# Patient Record
Sex: Male | Born: 1970 | Race: White | Hispanic: No | Marital: Married | Smoking: Current every day smoker
Health system: Southern US, Community
[De-identification: ages and names within clinical notes are randomized; demographics above are authoritative.]

## PROBLEM LIST (undated history)

## (undated) DIAGNOSIS — F419 Anxiety disorder, unspecified: Secondary | ICD-10-CM

## (undated) DIAGNOSIS — F319 Bipolar disorder, unspecified: Secondary | ICD-10-CM

## (undated) DIAGNOSIS — F1120 Opioid dependence, uncomplicated: Secondary | ICD-10-CM

## (undated) DIAGNOSIS — E039 Hypothyroidism, unspecified: Secondary | ICD-10-CM

## (undated) HISTORY — PX: NO PAST SURGERIES: SHX2092

---

## 1999-02-02 ENCOUNTER — Emergency Department (HOSPITAL_COMMUNITY): Admission: EM | Admit: 1999-02-02 | Discharge: 1999-02-02 | Payer: Self-pay | Admitting: Internal Medicine

## 2008-01-14 ENCOUNTER — Encounter: Admission: RE | Admit: 2008-01-14 | Discharge: 2008-01-14 | Payer: Self-pay | Admitting: Chiropractic Medicine

## 2011-01-15 ENCOUNTER — Emergency Department (HOSPITAL_COMMUNITY)
Admission: EM | Admit: 2011-01-15 | Discharge: 2011-01-15 | Disposition: A | Discharge status: Home / Self Care | Payer: Self-pay | Attending: Emergency Medicine | Admitting: Emergency Medicine

## 2011-01-15 DIAGNOSIS — F1110 Opioid abuse, uncomplicated: Secondary | ICD-10-CM | POA: Insufficient documentation

## 2011-02-20 ENCOUNTER — Ambulatory Visit (HOSPITAL_COMMUNITY)
Admission: RE | Admit: 2011-02-20 | Discharge: 2011-02-20 | Disposition: A | Discharge status: Home / Self Care | Payer: Self-pay | Attending: Psychiatry | Admitting: Psychiatry

## 2011-02-20 DIAGNOSIS — F1920 Other psychoactive substance dependence, uncomplicated: Secondary | ICD-10-CM | POA: Insufficient documentation

## 2011-02-25 ENCOUNTER — Other Ambulatory Visit (HOSPITAL_COMMUNITY): Payer: Self-pay | Attending: Psychiatry | Admitting: Psychiatry

## 2011-02-25 DIAGNOSIS — M549 Dorsalgia, unspecified: Secondary | ICD-10-CM | POA: Insufficient documentation

## 2011-02-25 DIAGNOSIS — F1910 Other psychoactive substance abuse, uncomplicated: Secondary | ICD-10-CM | POA: Insufficient documentation

## 2011-02-25 DIAGNOSIS — G8929 Other chronic pain: Secondary | ICD-10-CM | POA: Insufficient documentation

## 2011-02-27 ENCOUNTER — Other Ambulatory Visit (HOSPITAL_COMMUNITY): Payer: Self-pay | Admitting: Psychiatry

## 2011-03-01 ENCOUNTER — Other Ambulatory Visit (HOSPITAL_COMMUNITY): Payer: Self-pay | Admitting: Psychiatry

## 2011-03-04 ENCOUNTER — Other Ambulatory Visit (HOSPITAL_COMMUNITY): Payer: Self-pay | Admitting: Psychiatry

## 2011-03-05 LAB — URINE DRUGS OF ABUSE SCREEN W ALC, ROUTINE (REF LAB)
Amphetamine Screen, Ur: NEGATIVE
Benzodiazepines.: NEGATIVE
Marijuana Metabolite: NEGATIVE
Methadone: NEGATIVE
Propoxyphene: NEGATIVE

## 2011-03-06 ENCOUNTER — Other Ambulatory Visit (HOSPITAL_COMMUNITY): Payer: Self-pay | Admitting: Psychiatry

## 2011-03-06 LAB — ETHANOL CONFIRM, URINE: Ethanol, Ur - Confirmation: 0.002 GMS% (ref ?–0.04)

## 2011-03-08 ENCOUNTER — Other Ambulatory Visit (HOSPITAL_COMMUNITY): Payer: Self-pay | Admitting: Psychiatry

## 2011-03-11 ENCOUNTER — Other Ambulatory Visit (HOSPITAL_COMMUNITY): Payer: Self-pay | Admitting: Psychiatry

## 2011-03-13 ENCOUNTER — Other Ambulatory Visit (HOSPITAL_COMMUNITY): Payer: Self-pay | Admitting: Psychiatry

## 2011-03-15 ENCOUNTER — Other Ambulatory Visit (HOSPITAL_COMMUNITY): Payer: Self-pay | Admitting: Psychiatry

## 2011-03-18 ENCOUNTER — Other Ambulatory Visit (HOSPITAL_COMMUNITY): Payer: Self-pay | Admitting: Psychiatry

## 2011-03-20 ENCOUNTER — Other Ambulatory Visit (HOSPITAL_COMMUNITY): Payer: Self-pay | Admitting: Psychiatry

## 2011-03-22 ENCOUNTER — Other Ambulatory Visit (HOSPITAL_COMMUNITY): Payer: Self-pay | Admitting: Psychiatry

## 2011-03-25 ENCOUNTER — Other Ambulatory Visit (HOSPITAL_COMMUNITY): Payer: Self-pay | Attending: Psychiatry | Admitting: Psychiatry

## 2011-03-29 ENCOUNTER — Other Ambulatory Visit (HOSPITAL_COMMUNITY): Payer: Self-pay | Admitting: Psychiatry

## 2011-04-01 ENCOUNTER — Other Ambulatory Visit (HOSPITAL_COMMUNITY): Payer: Self-pay | Admitting: Psychiatry

## 2011-04-03 ENCOUNTER — Other Ambulatory Visit (HOSPITAL_COMMUNITY): Payer: Self-pay | Admitting: Psychiatry

## 2011-04-05 ENCOUNTER — Other Ambulatory Visit (HOSPITAL_COMMUNITY): Payer: Self-pay | Admitting: Psychiatry

## 2011-04-08 ENCOUNTER — Other Ambulatory Visit (HOSPITAL_COMMUNITY): Payer: Self-pay | Admitting: Psychiatry

## 2011-04-09 ENCOUNTER — Ambulatory Visit (INDEPENDENT_AMBULATORY_CARE_PROVIDER_SITE_OTHER): Payer: Self-pay | Admitting: Behavioral Health

## 2011-04-09 DIAGNOSIS — F411 Generalized anxiety disorder: Secondary | ICD-10-CM

## 2011-04-09 DIAGNOSIS — F331 Major depressive disorder, recurrent, moderate: Secondary | ICD-10-CM

## 2011-04-10 ENCOUNTER — Other Ambulatory Visit (HOSPITAL_COMMUNITY): Payer: Self-pay | Admitting: Psychiatry

## 2011-04-12 ENCOUNTER — Other Ambulatory Visit (HOSPITAL_COMMUNITY): Payer: Self-pay | Admitting: Psychiatry

## 2011-04-15 ENCOUNTER — Other Ambulatory Visit (HOSPITAL_COMMUNITY): Payer: Self-pay | Admitting: Psychiatry

## 2011-04-17 ENCOUNTER — Other Ambulatory Visit (HOSPITAL_COMMUNITY): Payer: Self-pay | Admitting: Psychiatry

## 2011-04-19 ENCOUNTER — Other Ambulatory Visit (HOSPITAL_COMMUNITY): Payer: Self-pay | Admitting: Psychiatry

## 2011-04-23 ENCOUNTER — Encounter (INDEPENDENT_AMBULATORY_CARE_PROVIDER_SITE_OTHER): Payer: Self-pay | Admitting: Behavioral Health

## 2011-04-23 DIAGNOSIS — F411 Generalized anxiety disorder: Secondary | ICD-10-CM

## 2011-05-07 ENCOUNTER — Encounter (HOSPITAL_COMMUNITY): Payer: Self-pay | Admitting: Behavioral Health

## 2011-05-14 ENCOUNTER — Encounter (HOSPITAL_COMMUNITY): Payer: Self-pay | Admitting: Behavioral Health

## 2011-05-21 ENCOUNTER — Encounter (INDEPENDENT_AMBULATORY_CARE_PROVIDER_SITE_OTHER): Payer: Self-pay | Admitting: Behavioral Health

## 2011-05-21 DIAGNOSIS — F411 Generalized anxiety disorder: Secondary | ICD-10-CM

## 2011-06-04 ENCOUNTER — Encounter (HOSPITAL_COMMUNITY): Payer: Self-pay | Admitting: Behavioral Health

## 2012-01-19 ENCOUNTER — Encounter (HOSPITAL_COMMUNITY): Payer: Self-pay

## 2012-01-19 ENCOUNTER — Emergency Department (HOSPITAL_COMMUNITY)
Admission: EM | Admit: 2012-01-19 | Discharge: 2012-01-19 | Disposition: A | Discharge status: Home / Self Care | Payer: Self-pay | Attending: Emergency Medicine | Admitting: Emergency Medicine

## 2012-01-19 DIAGNOSIS — F17200 Nicotine dependence, unspecified, uncomplicated: Secondary | ICD-10-CM | POA: Insufficient documentation

## 2012-01-19 DIAGNOSIS — S0191XA Laceration without foreign body of unspecified part of head, initial encounter: Secondary | ICD-10-CM

## 2012-01-19 DIAGNOSIS — S0180XA Unspecified open wound of other part of head, initial encounter: Secondary | ICD-10-CM | POA: Insufficient documentation

## 2012-01-19 HISTORY — DX: Opioid dependence, uncomplicated: F11.20

## 2012-01-19 MED ORDER — LIDOCAINE-EPINEPHRINE (PF) 2 %-1:200000 IJ SOLN: 10.0000 mL | Freq: Once | INTRAMUSCULAR | Status: DC

## 2012-01-19 MED ORDER — AMOXICILLIN-POT CLAVULANATE 875-125 MG PO TABS: 1.0000 | ORAL_TABLET | Freq: Two times a day (BID) | ORAL | Status: DC
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
## 2012-01-19 NOTE — ED Notes (Signed)
Pt in from home with laceration to the forehead states bumped his head denies loc bleeding controlled denies pain
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
## 2012-01-21 NOTE — ED Provider Notes (Signed)
Medical screening examination/treatment/procedure(s) were performed by non-physician practitioner and as supervising physician I was immediately available for consultation/collaboration.  Raeford Razor, MD 01/21/12 1900

## 2012-01-24 ENCOUNTER — Encounter (HOSPITAL_COMMUNITY): Payer: Self-pay | Admitting: Emergency Medicine

## 2012-01-24 ENCOUNTER — Emergency Department (HOSPITAL_COMMUNITY)
Admission: EM | Admit: 2012-01-24 | Discharge: 2012-01-24 | Disposition: A | Discharge status: Home / Self Care | Payer: Self-pay | Attending: Emergency Medicine | Admitting: Emergency Medicine

## 2012-01-24 DIAGNOSIS — Z4802 Encounter for removal of sutures: Secondary | ICD-10-CM | POA: Insufficient documentation

## 2012-01-24 NOTE — ED Notes (Signed)
Pt presenting to ed with c/o suture removal pt states his wife removed all of them but the one that she couldn't get out. Pt states he did not want to come back here because he's uninsured. Pt is alert and oriented at this time. Pt is in nad
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
## 2012-01-24 NOTE — Discharge Instructions (Signed)
Suture Removal  Your caregiver has removed your sutures today. If skin adhesive strips were applied at the time of suturing, or applied following removal of the sutures today, they will begin to peel off in a couple more days. If skin adhesive strips remain after 14 days, they may be removed.  HOME CARE INSTRUCTIONS     Change any bandages (dressings) at least once a day or as directed by your caregiver. If the bandage sticks, soak it off with warm, soapy water.    Wash the area with soap and water to remove all the cream or ointment (if you were instructed to use any) 2 times a day. Rinse off the soap and pat the area dry with a clean towel.    Reapply cream or ointment as directed by your caregiver. This will help prevent infection and keep the bandage from sticking.    Keep the wound area dry and clean. If the bandage becomes wet, dirty, or develops a bad smell, change it as soon as possible.    Only take over-the-counter or prescription medicines for pain, discomfort, or fever as directed by your caregiver.    Use sunscreen when out in the sun. New scars become sunburned easily.    Return to your caregivers office in in 7 days or as directed to have your sutures removed.   You may need a tetanus shot if:   You cannot remember when you had your last tetanus shot.    You have never had a tetanus shot.    The injury broke your skin.   If you got a tetanus shot, your arm may swell, get red, and feel warm to the touch. This is common and not a problem. If you need a tetanus shot and you choose not to have one, there is a rare chance of getting tetanus. Sickness from tetanus can be serious.  SEEK IMMEDIATE MEDICAL CARE IF:     There is redness, swelling, or increasing pain in the wound.    Pus is coming from the wound.    An unexplained oral temperature above 102 F (38.9 C) develops.    You notice a bad smell coming from the wound or dressing.     The wound breaks open (edges not staying together) after sutures have been removed.   Document Released: 06/04/2001 Document Revised: 08/29/2011 Document Reviewed: 08/03/2007  [employer] Patient Information 2012 [employer]

## 2012-01-25 NOTE — ED Provider Notes (Signed)
Medical screening examination/treatment/procedure(s) were performed by non-physician practitioner and as supervising physician I was immediately available for consultation/collaboration.   Gerhard Munch, MD 01/25/12 [phone]

## 2012-03-02 ENCOUNTER — Emergency Department (HOSPITAL_COMMUNITY)
Admission: EM | Admit: 2012-03-02 | Discharge: 2012-03-02 | Disposition: A | Discharge status: Home / Self Care | Payer: Self-pay | Attending: Emergency Medicine | Admitting: Emergency Medicine

## 2012-03-02 ENCOUNTER — Encounter (HOSPITAL_COMMUNITY): Payer: Self-pay | Admitting: Emergency Medicine

## 2012-03-02 DIAGNOSIS — F17200 Nicotine dependence, unspecified, uncomplicated: Secondary | ICD-10-CM | POA: Insufficient documentation

## 2012-03-02 DIAGNOSIS — R52 Pain, unspecified: Secondary | ICD-10-CM | POA: Insufficient documentation

## 2012-03-02 DIAGNOSIS — M5440 Lumbago with sciatica, unspecified side: Secondary | ICD-10-CM

## 2012-03-02 DIAGNOSIS — F1120 Opioid dependence, uncomplicated: Secondary | ICD-10-CM | POA: Insufficient documentation

## 2012-03-02 DIAGNOSIS — M5430 Sciatica, unspecified side: Secondary | ICD-10-CM | POA: Insufficient documentation

## 2012-03-02 MED ORDER — PREDNISONE 20 MG PO TABS
60.0000 mg | ORAL_TABLET | Freq: Once | ORAL | Status: AC
  Administered 2012-03-02: 60 mg via ORAL
  Filled 2012-03-02: qty 1

## 2012-03-02 MED ORDER — METHOCARBAMOL 750 MG PO TABS: 1500.0000 mg | ORAL_TABLET | Freq: Three times a day (TID) | ORAL | Status: AC

## 2012-03-02 MED ORDER — IBUPROFEN 800 MG PO TABS: 800.0000 mg | ORAL_TABLET | Freq: Three times a day (TID) | ORAL | Status: AC

## 2012-03-02 MED ORDER — IBUPROFEN 800 MG PO TABS
800.0000 mg | ORAL_TABLET | Freq: Once | ORAL | Status: AC
  Administered 2012-03-02: 800 mg via ORAL
  Filled 2012-03-02: qty 1

## 2012-03-02 MED ORDER — PREDNISONE 20 MG PO TABS: 60.0000 mg | ORAL_TABLET | Freq: Every day | ORAL | Status: AC

## 2012-03-02 MED ORDER — METHOCARBAMOL 500 MG PO TABS
1000.0000 mg | ORAL_TABLET | Freq: Once | ORAL | Status: DC
  Filled 2012-03-02: qty 2

## 2012-03-02 NOTE — ED Provider Notes (Signed)
History     CSN: [id number]  Arrival date & time 03/02/12  0605   First MD Initiated Contact with Patient 03/02/12 [phone]      Chief Complaint  Patient presents with  . Back Pain    (Consider location/radiation/quality/duration/timing/severity/associated sxs/prior treatment) HPI 41 yo male presents to the ER with complaint of low back pain radiating down right buttock and into leg.  Pt with h/o of chronic back pain, opiate addiction due to prior pain medications.  Pt slipped yesterday while running on wet pool deck.  Pt denies bowel or bladder incontinence, no foot drop, no sacral numbness.  Pt has tried several patches of unknown medication from friends, topical cream.  Pt has nsaids and other medications he has tried as well.  Pt requesting MRI to "get to the bottom of this"  Past Medical History  Diagnosis Date  . Opiate addiction     History reviewed. No pertinent past surgical history.  No family history on file.  History  Substance Use Topics  . Smoking status: Current Everyday Smoker  . Smokeless tobacco: Not on file  . Alcohol Use: Yes     as often as possible but my family prevents me from drinking most of the time      Review of Systems  All other systems reviewed and are negative.    Allergies  Review of patient's allergies indicates no known allergies.  Home Medications   Current Outpatient Rx  Name Route Sig Dispense Refill  . BUPRENORPHINE HCL-NALOXONE HCL 8-2 MG SL SUBL Sublingual Place 1 tablet under the tongue 2 (two) times daily.       BP 110/89  Pulse 63  Temp 98 F (36.7 C)  Resp 16  Wt 150 lb (68.04 kg)  SpO2 99%  Physical Exam  Nursing note and vitals reviewed. Constitutional: He is oriented to person, place, and time. He appears well-developed and well-nourished.  HENT:  Head: Normocephalic and atraumatic.  Nose: Nose normal.  Mouth/Throat: Oropharynx is clear and moist.  Eyes: Conjunctivae and EOM are normal. Pupils are equal,  round, and reactive to light.  Neck: Normal range of motion. Neck supple. No JVD present. No tracheal deviation present. No thyromegaly present.  Cardiovascular: Normal rate, regular rhythm, normal heart sounds and intact distal pulses.  Exam reveals no gallop and no friction rub.   No murmur heard. Pulmonary/Chest: Effort normal and breath sounds normal. No stridor. No respiratory distress. He has no wheezes. He has no rales. He exhibits no tenderness.  Abdominal: Soft. Bowel sounds are normal. He exhibits no distension and no mass. There is no tenderness. There is no rebound and no guarding.  Musculoskeletal: Normal range of motion. He exhibits no edema and no tenderness.       No paraspinal muscle tenderness, mild right buttock and SI joint tenderness  Lymphadenopathy:    He has no cervical adenopathy.  Neurological: He is oriented to person, place, and time. He has normal reflexes. No cranial nerve deficit. He exhibits normal muscle tone. Coordination normal.       +positive straight leg raise on left.  Normal sensation  Skin: Skin is dry. No rash noted. No erythema. No pallor.  Psychiatric: He has a normal mood and affect. His behavior is normal. Judgment and thought content normal.    ED Course  Procedures (including critical care time)  Labs Reviewed - No data to display No results found.   1. Acute back pain with sciatica  MDM  41 yo male with acute on chronic low back pain, suspect sciatica.  No signs of spinal cord injury on exam, no red flags.  Pt requesting MRI, but no acute neurologic findings to require emergent MRI.  Will treat with nsaids, steroids, muscle relaxants without opiates given h/o suboxone use and opiate addiction       Olivia Mackie, MD 03/02/12 [phone]
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
## 2012-03-02 NOTE — Discharge Instructions (Signed)
Take medications as prescribed.  Follow up with orthopedics if pain does not improve.  Return to the ER for worsening pain, numbness, weakness, loss of bowel or bladder control or other concerning symptoms.  Sciatica with Rehab The sciatic nerve runs from the back down the leg and is responsible for sensation and control of the muscles in the back (posterior) side of the thigh, lower leg, and foot. Sciatica is a condition that is characterized by inflammation of this nerve.  SYMPTOMS   Signs of nerve damage, including numbness and/or weakness along the posterior side of the lower extremity.   Pain in the back of the thigh that may also travel down the leg.   Pain that worsens when sitting for long periods of time.   Occasionally, pain in the back or buttock.  CAUSES  Inflammation of the sciatic nerve is the cause of sciatica. The inflammation is due to something irritating the nerve. Common sources of irritation include:  Sitting for long periods of time.   Direct trauma to the nerve.   Arthritis of the spine.   Herniated or ruptured disk.   Slipping of the vertebrae (spondylolithesis)   Pressure from soft tissues, such as muscles or ligament-like tissue (fascia).  RISK INCREASES WITH:  Sports that place pressure or stress on the spine (football or weightlifting).   Poor strength and flexibility.   Failure to warm-up properly before activity.   Family history of low back pain or disk disorders.   Previous back injury or surgery.   Poor body mechanics, especially when lifting, or poor posture.  PREVENTION   Warm up and stretch properly before activity.   Maintain physical fitness:   Strength, flexibility, and endurance.   Cardiovascular fitness.   Learn and use proper technique, especially with posture and lifting. When possible, have coach correct improper technique.   Avoid activities that place stress on the spine.  PROGNOSIS If treated properly, then sciatica  usually resolves within 6 weeks. However, occasionally surgery is necessary.  RELATED COMPLICATIONS   Permanent nerve damage, including pain, numbness, tingle, or weakness.   Chronic back pain.   Risks of surgery: infection, bleeding, nerve damage, or damage to surrounding tissues.  TREATMENT Treatment initially involves resting from any activities that aggravate your symptoms. The use of ice and medication may help reduce pain and inflammation. The use of strengthening and stretching exercises may help reduce pain with activity. These exercises may be performed at home or with referral to a therapist. A therapist may recommend further treatments, such as transcutaneous electronic nerve stimulation (TENS) or ultrasound. Your caregiver may recommend corticosteroid injections to help reduce inflammation of the sciatic nerve. If symptoms persist despite non-surgical (conservative) treatment, then surgery may be recommended. MEDICATION  If pain medication is necessary, then nonsteroidal anti-inflammatory medications, such as aspirin and ibuprofen, or other minor pain relievers, such as acetaminophen, are often recommended.   Do not take pain medication for 7 days before surgery.   Prescription pain relievers may be given if deemed necessary by your caregiver. Use only as directed and only as much as you need.   Ointments applied to the skin may be helpful.   Corticosteroid injections may be given by your caregiver. These injections should be reserved for the most serious cases, because they may only be given a certain number of times.  HEAT AND COLD  Cold treatment (icing) relieves pain and reduces inflammation. Cold treatment should be applied for 10 to 15 minutes every 2  to 3 hours for inflammation and pain and immediately after any activity that aggravates your symptoms. Use ice packs or massage the area with a piece of ice (ice massage).   Heat treatment may be used prior to performing the  stretching and strengthening activities prescribed by your caregiver, physical therapist, or athletic trainer. Use a heat pack or soak the injury in warm water.  SEEK MEDICAL CARE IF:  Treatment seems to offer no benefit, or the condition worsens.   Any medications produce adverse side effects.  EXERCISES  RANGE OF MOTION (ROM) AND STRETCHING EXERCISES - Sciatica Most people with sciatic will find that their symptoms worsen with either excessive bending forward (flexion) or arching at the low back (extension). The exercises which will help resolve your symptoms will focus on the opposite motion. Your physician, physical therapist or athletic trainer will help you determine which exercises will be most helpful to resolve your low back pain. Do not complete any exercises without first consulting with your clinician. Discontinue any exercises which worsen your symptoms until you speak to your clinician. If you have pain, numbness or tingling which travels down into your buttocks, leg or foot, the goal of the therapy is for these symptoms to move closer to your back and eventually resolve. Occasionally, these leg symptoms will get better, but your low back pain may worsen; this is typically an indication of progress in your rehabilitation. Be certain to be very alert to any changes in your symptoms and the activities in which you participated in the 24 hours prior to the change. Sharing this information with your clinician will allow him/her to most efficiently treat your condition. These exercises may help you when beginning to rehabilitate your injury. Your symptoms may resolve with or without further involvement from your physician, physical therapist or athletic trainer. While completing these exercises, remember:   Restoring tissue flexibility helps normal motion to return to the joints. This allows healthier, less painful movement and activity.   An effective stretch should be held for at least 30  seconds.   A stretch should never be painful. You should only feel a gentle lengthening or release in the stretched tissue.  FLEXION RANGE OF MOTION AND STRETCHING EXERCISES: STRETCH - Flexion, Single Knee to Chest   Lie on a firm bed or floor with both legs extended in front of you.   Keeping one leg in contact with the floor, bring your opposite knee to your chest. Hold your leg in place by either grabbing behind your thigh or at your knee.   Pull until you feel a gentle stretch in your low back. Hold ___30___ seconds.   Slowly release your grasp and repeat the exercise with the opposite side.  Repeat _____5_____ times. Complete this exercise ____2______ times per day.  STRETCH - Flexion, Double Knee to Chest  Lie on a firm bed or floor with both legs extended in front of you.   Keeping one leg in contact with the floor, bring your opposite knee to your chest.   Tense your stomach muscles to support your back and then lift your other knee to your chest. Hold your legs in place by either grabbing behind your thighs or at your knees.   Pull both knees toward your chest until you feel a gentle stretch in your low back. Hold ____30______ seconds.   Tense your stomach muscles and slowly return one leg at a time to the floor.  Repeat ____5______ times. Complete this exercise  ____2______ times per day.  STRETCH - Low Trunk Rotation   Lie on a firm bed or floor. Keeping your legs in front of you, bend your knees so they are both pointed toward the ceiling and your feet are flat on the floor.   Extend your arms out to the side. This will stabilize your upper body by keeping your shoulders in contact with the floor.   Gently and slowly drop both knees together to one side until you feel a gentle stretch in your low back. Hold for ____30______ seconds.   Tense your stomach muscles to support your low back as you bring your knees back to the starting position. Repeat the exercise to the other  side.  Repeat _____5_____ times. Complete this exercise ___2_______ times per day  EXTENSION RANGE OF MOTION AND FLEXIBILITY EXERCISES: STRETCH - Extension, Prone on Elbows  Lie on your stomach on the floor, a bed will be too soft. Place your palms about shoulder width apart and at the height of your head.   Place your elbows under your shoulders. If this is too painful, stack pillows under your chest.   Allow your body to relax so that your hips drop lower and make contact more completely with the floor.   Hold this position for _____30_____ seconds.   Slowly return to lying flat on the floor.  Repeat ____5______ times. Complete this exercise _____2_____ times per day.  RANGE OF MOTION - Extension, Prone Press Ups  Lie on your stomach on the floor, a bed will be too soft. Place your palms about shoulder width apart and at the height of your head.   Keeping your back as relaxed as possible, slowly straighten your elbows while keeping your hips on the floor. You may adjust the placement of your hands to maximize your comfort. As you gain motion, your hands will come more underneath your shoulders.   Hold this position ___30_______ seconds.   Slowly return to lying flat on the floor.  Repeat ___5_______ times. Complete this exercise ______2____ times per day.  STRENGTHENING EXERCISES - Sciatica  These exercises may help you when beginning to rehabilitate your injury. These exercises should be done near your "sweet spot." This is the neutral, low-back arch, somewhere between fully rounded and fully arched, that is your least painful position. When performed in this safe range of motion, these exercises can be used for people who have either a flexion or extension based injury. These exercises may resolve your symptoms with or without further involvement from your physician, physical therapist or athletic trainer. While completing these exercises, remember:   Muscles can gain both the  endurance and the strength needed for everyday activities through controlled exercises.   Complete these exercises as instructed by your physician, physical therapist or athletic trainer. Progress with the resistance and repetition exercises only as your caregiver advises.   You may experience muscle soreness or fatigue, but the pain or discomfort you are trying to eliminate should never worsen during these exercises. If this pain does worsen, stop and make certain you are following the directions exactly. If the pain is still present after adjustments, discontinue the exercise until you can discuss the trouble with your clinician.  STRENGTHENING - Deep Abdominals, Pelvic Tilt   Lie on a firm bed or floor. Keeping your legs in front of you, bend your knees so they are both pointed toward the ceiling and your feet are flat on the floor.   Tense your lower  abdominal muscles to press your low back into the floor. This motion will rotate your pelvis so that your tail bone is scooping upwards rather than pointing at your feet or into the floor.   With a gentle tension and even breathing, hold this position for _____30_____ seconds.  Repeat ____5______ times. Complete this exercise _____2_____ times per day.  STRENGTHENING - Abdominals, Crunches   Lie on a firm bed or floor. Keeping your legs in front of you, bend your knees so they are both pointed toward the ceiling and your feet are flat on the floor. Cross your arms over your chest.   Slightly tip your chin down without bending your neck.   Tense your abdominals and slowly lift your trunk high enough to just clear your shoulder blades. Lifting higher can put excessive stress on the low back and does not further strengthen your abdominal muscles.   Control your return to the starting position.  Repeat ___10_______ times. Complete this exercise _____2_____ times per day.  STRENGTHENING - Quadruped, Opposite UE/LE Lift  Assume a hands and knees  position on a firm surface. Keep your hands under your shoulders and your knees under your hips. You may place padding under your knees for comfort.   Find your neutral spine and gently tense your abdominal muscles so that you can maintain this position. Your shoulders and hips should form a rectangle that is parallel with the floor and is not twisted.   Keeping your trunk steady, lift your right hand no higher than your shoulder and then your left leg no higher than your hip. Make sure you are not holding your breath. Hold this position 30 seconds.   Continuing to keep your abdominal muscles tense and your back steady, slowly return to your starting position. Repeat with the opposite arm and leg.  Repeat __5_ times. Complete this exercise _____2____ times per day.  STRENGTHENING - Abdominals and Quadriceps, Straight Leg Raise   Lie on a firm bed or floor with both legs extended in front of you.   Keeping one leg in contact with the floor, bend the other knee so that your foot can rest flat on the floor.   Find your neutral spine, and tense your abdominal muscles to maintain your spinal position throughout the exercise.   Slowly lift your straight leg off the floor about 6 inches for a count of 15, making sure to not hold your breath.   Still keeping your neutral spine, slowly lower your leg all the way to the floor.  Repeat this exercise with each leg ___5_______ times. Complete this exercise ____2______ times per day. POSTURE AND BODY MECHANICS CONSIDERATIONS - Sciatica Keeping correct posture when sitting, standing or completing your activities will reduce the stress put on different body tissues, allowing injured tissues a chance to heal and limiting painful experiences. The following are general guidelines for improved posture. Your physician or physical therapist will provide you with any instructions specific to your needs. While reading these guidelines, remember:  The exercises  prescribed by your provider will help you have the flexibility and strength to maintain correct postures.   The correct posture provides the optimal environment for your joints to work. All of your joints have less wear and tear when properly supported by a spine with good posture. This means you will experience a healthier, less painful body.   Correct posture must be practiced with all of your activities, especially prolonged sitting and standing. Correct posture is as  important when doing repetitive low-stress activities (typing) as it is when doing a single heavy-load activity (lifting).  RESTING POSITIONS Consider which positions are most painful for you when choosing a resting position. If you have pain with flexion-based activities (sitting, bending, stooping, squatting), choose a position that allows you to rest in a less flexed posture. You would want to avoid curling into a fetal position on your side. If your pain worsens with extension-based activities (prolonged standing, working overhead), avoid resting in an extended position such as sleeping on your stomach. Most people will find more comfort when they rest with their spine in a more neutral position, neither too rounded nor too arched. Lying on a non-sagging bed on your side with a pillow between your knees, or on your back with a pillow under your knees will often provide some relief. Keep in mind, being in any one position for a prolonged period of time, no matter how correct your posture, can still lead to stiffness. PROPER SITTING POSTURE In order to minimize stress and discomfort on your spine, you must sit with correct posture Sitting with good posture should be effortless for a healthy body. Returning to good posture is a gradual process. Many people can work toward this most comfortably by using various supports until they have the flexibility and strength to maintain this posture on their own. When sitting with proper posture, your  ears will fall over your shoulders and your shoulders will fall over your hips. You should use the back of the chair to support your upper back. Your low back will be in a neutral position, just slightly arched. You may place a small pillow or folded towel at the base of your low back for support.  When working at a desk, create an environment that supports good, upright posture. Without extra support, muscles fatigue and lead to excessive strain on joints and other tissues. Keep these recommendations in mind: CHAIR:   A chair should be able to slide under your desk when your back makes contact with the back of the chair. This allows you to work closely.   The chair's height should allow your eyes to be level with the upper part of your monitor and your hands to be slightly lower than your elbows.  BODY POSITION  Your feet should make contact with the floor. If this is not possible, use a foot rest.   Keep your ears over your shoulders. This will reduce stress on your neck and low back.  INCORRECT SITTING POSTURES   If you are feeling tired and unable to assume a healthy sitting posture, do not slouch or slump. This puts excessive strain on your back tissues, causing more damage and pain. Healthier options include:   Using more support, like a lumbar pillow.   Switching tasks to something that requires you to be upright or walking.   Talking a brief walk.   Lying down to rest in a neutral-spine position.  PROLONGED STANDING WHILE SLIGHTLY LEANING FORWARD  When completing a task that requires you to lean forward while standing in one place for a long time, place either foot up on a stationary 2-4 inch high object to help maintain the best posture. When both feet are on the ground, the low back tends to lose its slight inward curve. If this curve flattens (or becomes too large), then the back and your other joints will experience too much stress, fatigue more quickly and can cause pain.  CORRECT STANDING POSTURES Proper standing posture should be assumed with all daily activities, even if they only take a few moments, like when brushing your teeth. As in sitting, your ears should fall over your shoulders and your shoulders should fall over your hips. You should keep a slight tension in your abdominal muscles to brace your spine. Your tailbone should point down to the ground, not behind your body, resulting in an over-extended swayback posture.  INCORRECT STANDING POSTURES  Common incorrect standing postures include a forward head, locked knees and/or an excessive swayback. WALKING Walk with an upright posture. Your ears, shoulders and hips should all line-up. PROLONGED ACTIVITY IN A FLEXED POSITION When completing a task that requires you to bend forward at your waist or lean over a low surface, try to find a way to stabilize 3 of 4 of your limbs. You can place a hand or elbow on your thigh or rest a knee on the surface you are reaching across. This will provide you more stability so that your muscles do not fatigue as quickly. By keeping your knees relaxed, or slightly bent, you will also reduce stress across your low back. CORRECT LIFTING TECHNIQUES DO :   Assume a wide stance. This will provide you more stability and the opportunity to get as close as possible to the object which you are lifting.   Tense your abdominals to brace your spine; then bend at the knees and hips. Keeping your back locked in a neutral-spine position, lift using your leg muscles. Lift with your legs, keeping your back straight.   Test the weight of unknown objects before attempting to lift them.   Try to keep your elbows locked down at your sides in order get the best strength from your shoulders when carrying an object.   Always ask for help when lifting heavy or awkward objects.  INCORRECT LIFTING TECHNIQUES DO NOT:   Lock your knees when lifting, even if it is a small object.   Bend and  twist. Pivot at your feet or move your feet when needing to change directions.   Assume that you cannot safely pick up a paperclip without proper posture.  Document Released: 09/09/2005 Document Revised: 08/29/2011 Document Reviewed: 12/22/2008 [facility] Patient Information 2012 [place], [place].

## 2012-04-21 ENCOUNTER — Encounter (HOSPITAL_COMMUNITY): Payer: Self-pay

## 2012-04-21 ENCOUNTER — Emergency Department (HOSPITAL_COMMUNITY)
Admission: EM | Admit: 2012-04-21 | Discharge: 2012-04-22 | Disposition: A | Discharge status: Home / Self Care | Payer: Self-pay | Attending: Emergency Medicine | Admitting: Emergency Medicine

## 2012-04-21 DIAGNOSIS — R634 Abnormal weight loss: Secondary | ICD-10-CM | POA: Insufficient documentation

## 2012-04-21 LAB — COMPREHENSIVE METABOLIC PANEL
Alkaline Phosphatase: 63 U/L (ref 39–117)
BUN: 6 mg/dL (ref 6–23)
CO2: 27 mEq/L (ref 19–32)
Chloride: 100 mEq/L (ref 96–112)
Creatinine, Ser: 0.99 mg/dL (ref 0.50–1.35)
GFR calc Af Amer: >90 mL/min (ref >90)
GFR calc non Af Amer: >90 mL/min (ref >90)
Glucose, Bld: 75 mg/dL (ref 70–99)
Potassium: 3.6 mEq/L (ref 3.5–5.1)
Total Bilirubin: 0.3 mg/dL (ref 0.3–1.2)

## 2012-04-21 LAB — CBC
HCT: 38.6 % — ABNORMAL LOW (ref 39.0–52.0)
Hemoglobin: 13.5 g/dL (ref 13.0–17.0)
MCV: 91.5 fL (ref 78.0–100.0)
WBC: 9.8 10*3/uL (ref 4.0–10.5)

## 2012-04-21 NOTE — ED Notes (Signed)
Pt states she is under a lot of stress and has lost about 50lbs over the last 3 months.

## 2012-04-21 NOTE — ED Notes (Signed)
Pt sts lost of weight contributed to stress.

## 2012-04-22 NOTE — ED Provider Notes (Signed)
The pt left after triage.  I did not perform a history of physical or medical screening examination of this patient  Lyanne Co, MD 04/22/12 [phone]

## 2012-04-22 NOTE — ED Notes (Signed)
Charge nurse made aware of pt leaving.

## 2012-04-22 NOTE — ED Notes (Addendum)
Pt stated that he wanted to go to his car to get something. Pt was advised not to leave until he was seen by the doctor to make sure labs were ok. Pt agreed to staying.

## 2012-04-22 NOTE — ED Notes (Signed)
Pt wanted to go to car and get something. Tech asked if he could and responded that he could not. Upon hearing this pt left room andstated that he could not take this any longer and that he would come back another day. Kristen from registration called to tell that pt stated he was leaving.

## 2013-09-15 ENCOUNTER — Emergency Department (HOSPITAL_COMMUNITY)
Admission: EM | Admit: 2013-09-15 | Discharge: 2013-09-15 | Disposition: A | Discharge status: Home / Self Care | Payer: BC Managed Care – PPO | Attending: Emergency Medicine | Admitting: Emergency Medicine

## 2013-09-15 ENCOUNTER — Encounter (HOSPITAL_COMMUNITY): Payer: Self-pay | Admitting: Emergency Medicine

## 2013-09-15 DIAGNOSIS — Z79899 Other long term (current) drug therapy: Secondary | ICD-10-CM | POA: Insufficient documentation

## 2013-09-15 DIAGNOSIS — F17200 Nicotine dependence, unspecified, uncomplicated: Secondary | ICD-10-CM | POA: Insufficient documentation

## 2013-09-15 DIAGNOSIS — Z202 Contact with and (suspected) exposure to infections with a predominantly sexual mode of transmission: Secondary | ICD-10-CM

## 2013-09-15 LAB — URINALYSIS, ROUTINE W REFLEX MICROSCOPIC
Glucose, UA: NEGATIVE mg/dL
Ketones, ur: NEGATIVE mg/dL
Leukocytes, UA: NEGATIVE
Nitrite: NEGATIVE
Protein, ur: NEGATIVE mg/dL
Urobilinogen, UA: 0.2 mg/dL (ref 0.0–1.0)

## 2013-09-15 LAB — HIV ANTIBODY (ROUTINE TESTING W REFLEX): HIV: NONREACTIVE

## 2013-09-15 MED ORDER — AZITHROMYCIN 250 MG PO TABS
1000.0000 mg | ORAL_TABLET | Freq: Once | ORAL | Status: AC
  Administered 2013-09-15: 1000 mg via ORAL
  Filled 2013-09-15: qty 4

## 2013-09-15 MED ORDER — LIDOCAINE HCL 1 % IJ SOLN
INTRAMUSCULAR | Status: AC
  Filled 2013-09-15: qty 20

## 2013-09-15 MED ORDER — CEFTRIAXONE SODIUM 250 MG IJ SOLR
250.0000 mg | Freq: Once | INTRAMUSCULAR | Status: AC
  Administered 2013-09-15: 250 mg via INTRAMUSCULAR
  Filled 2013-09-15: qty 250

## 2013-09-15 NOTE — ED Notes (Signed)
Patient's girlfriend reported having a bump in her vaginal area.  Patient wants to be checked out to make sure he doesn't have an STD.  No symptoms at this time.

## 2013-09-15 NOTE — ED Provider Notes (Signed)
CSN: [id number]     Arrival date & time 09/15/13  5621 History   First MD Initiated Contact with Patient 09/15/13 [phone]     Chief Complaint  Patient presents with  . Exposure to STD   (Consider location/radiation/quality/duration/timing/severity/associated sxs/prior Treatment) HPI Comments: Patient requesting STD check. His wife/girlfriend reported to him that the she had a "something on her vagina". He does not know what. He denies any symptoms. No discharge, abdominal pain, testicular pain, dysuria hematuria. 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 He has a remote history of opiate abuse and is on Suboxone. He admits to relapsing with IV heroin "occasionally". He is sexually active with one partner, last activity 1 week ago.  The history is provided by the patient.    Past Medical History  Diagnosis Date  . Opiate addiction    History reviewed. No pertinent past surgical history. No family history on file. History  Substance Use Topics  . Smoking status: Current Every Day Smoker -- 0.50 packs/day    Types: Cigarettes  . Smokeless tobacco: Not on file  . Alcohol Use: Yes     Comment: as often as possible but my family prevents me from drinking most of the time    Review of Systems  Constitutional: Negative for fever, activity change and appetite change.  Respiratory: Negative for cough, chest tightness and shortness of breath.   Cardiovascular: Negative for chest pain.  Gastrointestinal: Negative for nausea, vomiting and abdominal pain.  Genitourinary: Negative for dysuria, hematuria, penile swelling, scrotal swelling and genital sores.  Musculoskeletal: Negative for back pain.  Skin: Negative for wound.  Neurological: Negative for dizziness, weakness and headaches.  A complete 10 system review of systems was obtained and all systems are negative except as noted in the HPI and PMH.    Allergies  Review of patient's allergies indicates no known  allergies.  Home Medications   Current Outpatient Rx  Name  Route  Sig  Dispense  Refill  . ALPRAZolam (XANAX) 1 MG tablet   Oral   Take 0.5 mg by mouth daily as needed. Anxiety.         . buprenorphine-naloxone (SUBOXONE) 8-2 MG SUBL   Sublingual   Place 1 tablet under the tongue 2 (two) times daily.           BP 124/101  Pulse 71  Temp(Src) 97.7 F (36.5 C) (Oral)  Resp 16  SpO2 97% Physical Exam  Constitutional: He is oriented to person, place, and time. He appears well-developed and well-nourished. No distress.  HENT:  Head: Normocephalic and atraumatic.  Mouth/Throat: Oropharynx is clear and moist. No oropharyngeal exudate.  Eyes: Conjunctivae and EOM are normal. Pupils are equal, round, and reactive to light.  Neck: Normal range of motion. Neck supple.  Cardiovascular: Normal rate, regular rhythm and normal heart sounds.   Pulmonary/Chest: Effort normal and breath sounds normal. No respiratory distress.  Abdominal: Soft. There is no tenderness. There is no rebound and no guarding.  Genitourinary:  Normal external genitalia.  No lesions.  No discharge, no testicular tenderness  Musculoskeletal: Normal range of motion. He exhibits no edema and no tenderness.  Neurological: He is alert and oriented to person, place, and time. No cranial nerve deficit. He exhibits normal muscle tone. Coordination normal.  Skin: Skin is warm.    ED Course  Procedures (including critical care time) Labs Review Labs Reviewed  URINALYSIS, ROUTINE W REFLEX MICROSCOPIC - Abnormal; Notable  for the following:    APPearance CLOUDY (*)    All other components within normal limits  GC/CHLAMYDIA PROBE AMP  RPR  HIV ANTIBODY (ROUTINE TESTING)  URINE MICROSCOPIC-ADD ON   Imaging Review No results found.  EKG Interpretation   None       MDM   1. Possible exposure to STD    STD check without symptoms. No genital lesions.  Hx IVDA in past.  Agreeable to empiric treatment for GC  and chlamydia.  Swabs sent. Also will send HIV and RPR.  Urinalysis negative. GC and chlamydia swabs pending. Patient has been empirically treated with Rocephin and azithromycin. Advised safe sex practices and refraining from IV drug use.  Advised patient he will be called regarding results if positive.  Glynn Octave, MD 09/15/13 [phone]

## 2013-09-19 ENCOUNTER — Emergency Department (HOSPITAL_COMMUNITY)
Admission: EM | Admit: 2013-09-19 | Discharge: 2013-09-19 | Disposition: A | Discharge status: Home / Self Care | Payer: BC Managed Care – PPO | Attending: Emergency Medicine | Admitting: Emergency Medicine

## 2013-09-19 ENCOUNTER — Encounter (HOSPITAL_COMMUNITY): Payer: Self-pay | Admitting: Emergency Medicine

## 2013-09-19 DIAGNOSIS — F17200 Nicotine dependence, unspecified, uncomplicated: Secondary | ICD-10-CM | POA: Insufficient documentation

## 2013-09-19 DIAGNOSIS — Z0000 Encounter for general adult medical examination without abnormal findings: Secondary | ICD-10-CM | POA: Insufficient documentation

## 2013-09-19 DIAGNOSIS — Z79899 Other long term (current) drug therapy: Secondary | ICD-10-CM | POA: Insufficient documentation

## 2013-09-19 NOTE — ED Notes (Signed)
Pt states that he is married and has had "several hundred sexual partners".  Pt states that he recently had a sexual encounter with a coworker who is also married.  Pt is paranoid that he has an STD.  Pt acting very manic at this point, going into specific detail on his most sexual encounter.  States that he can do a "jedi mind trick on his dick" while taking viagra where he can "have 10 erections and never come".  States that that's what happened this last time and that he was trying "to hit her g spot" and bent his penis.  States that he "might have had a little blood come out" when he urinated after this rough sex.  Pt appears well-to-do and is adamant about wanting an MRI because he "can pay for it".  Pt states that he was here 3 days ago and was tested but wants to be tested again.

## 2013-09-19 NOTE — ED Provider Notes (Signed)
CSN: [id number]     Arrival date & time 09/19/13  1334 History   First MD Initiated Contact with Patient 09/19/13 1550    This chart was scribed for Nelle Don, a non-physician practitioner working with Hurman Horn, MD by Lewanda Rife, ED Scribe. This patient was seen in room WTR6/WTR6 and the patient's care was started at 4:02 PM     Chief Complaint  Patient presents with  . SEXUALLY TRANSMITTED DISEASE   (Consider location/radiation/quality/duration/timing/severity/associated sxs/prior Treatment) The history is provided by the patient. No language interpreter was used.   HPI Comments: Gregory Hardy is a 42 y.o. male who presents to the Emergency Department complaining of possible herpes infection. Denies associated genital lesions, dysuria, hematuria. Recent ED visit and had neg GC/chlamydia, RPR, HIV. He admits to recent unprotected sex.   Past Medical History  Diagnosis Date  . Opiate addiction    No past surgical history on file. No family history on file. History  Substance Use Topics  . Smoking status: Current Every Day Smoker -- 0.50 packs/day    Types: Cigarettes  . Smokeless tobacco: Not on file  . Alcohol Use: Yes     Comment: as often as possible but my family prevents me from drinking most of the time    Review of Systems  Constitutional: Negative for fever.  HENT: Negative for sore throat.   Eyes: Negative for discharge.  Gastrointestinal: Negative for rectal pain.  Genitourinary: Negative for dysuria, frequency, discharge, genital sores, penile pain and testicular pain.  Musculoskeletal: Negative for arthralgias.  Skin: Negative for rash.  Hematological: Negative for adenopathy.  Psychiatric/Behavioral: Negative for confusion.    Allergies  Review of patient's allergies indicates no known allergies.  Home Medications   Current Outpatient Rx  Name  Route  Sig  Dispense  Refill  . ALPRAZolam (XANAX) 1 MG tablet   Oral   Take 0.5 mg by  mouth daily as needed for anxiety. Anxiety.         . buprenorphine-naloxone (SUBOXONE) 8-2 MG SUBL   Sublingual   Place 1 tablet under the tongue 3 (three) times daily.          Marland Kitchen ibuprofen (ADVIL,MOTRIN) 200 MG tablet   Oral   Take 400 mg by mouth every 6 (six) hours as needed for mild pain or moderate pain.          BP 105/69  Pulse 70  Temp(Src) 98.1 F (36.7 C) (Oral)  Resp 16  SpO2 98% Physical Exam  Nursing note and vitals reviewed. Constitutional: He appears well-developed and well-nourished. No distress.  HENT:  Head: Normocephalic and atraumatic.  Eyes: Conjunctivae and EOM are normal.  Neck: Normal range of motion. Neck supple. No tracheal deviation present.  Cardiovascular: Normal rate.   Pulmonary/Chest: Effort normal. No respiratory distress.  Genitourinary: Testes normal and penis normal. Right testis shows no swelling and no tenderness. Left testis shows no swelling and no tenderness. No penile erythema or penile tenderness. No discharge found.  Musculoskeletal: Normal range of motion.  Lymphadenopathy:       Right: No inguinal adenopathy present.       Left: No inguinal adenopathy present.  Neurological: He is alert.  Skin: Skin is warm and dry.  Psychiatric: He has a normal mood and affect. His behavior is normal.    ED Course  Procedures (including critical care time)  COORDINATION OF CARE:  Nursing notes reviewed. Vital signs reviewed. Initial pt interview and examination  performed.   Treatment plan initiated:Medications - No data to display   Initial diagnostic testing ordered.    Labs Review Labs Reviewed - No data to display Imaging Review No results found.  EKG Interpretation   None      Patient seen and examined.    Vital signs reviewed and are as follows: Filed Vitals:   09/19/13 1627  BP: 145/92  Pulse: 61  Temp: 98.4 F (36.9 C)  Resp: 18   Reviewed recent STD test results with patient.   He has normal exam  today. I have asked patient to follow-up with PCP -- referrals given.   MDM   1. Normal physical examination    Patient with concern over whether or not he has herpes infection. Normal exam today. Referred to PCP.   I personally performed the services described in this documentation, which was scribed in my presence. The recorded information has been reviewed and is accurate.    Corian Handley, PA-C 09/19/13 [phone]

## 2013-09-20 NOTE — ED Provider Notes (Signed)
Medical screening examination/treatment/procedure(s) were performed by non-physician practitioner and as supervising physician I was immediately available for consultation/collaboration.   Hurman Horn, MD 09/20/13 [phone]

## 2013-09-27 ENCOUNTER — Encounter (HOSPITAL_COMMUNITY): Payer: Self-pay | Admitting: Emergency Medicine

## 2013-09-27 ENCOUNTER — Emergency Department (HOSPITAL_COMMUNITY)
Admission: EM | Admit: 2013-09-27 | Discharge: 2013-09-27 | Disposition: A | Discharge status: Home / Self Care | Payer: BC Managed Care – PPO | Attending: Emergency Medicine | Admitting: Emergency Medicine

## 2013-09-27 DIAGNOSIS — Z79899 Other long term (current) drug therapy: Secondary | ICD-10-CM | POA: Insufficient documentation

## 2013-09-27 DIAGNOSIS — H00013 Hordeolum externum right eye, unspecified eyelid: Secondary | ICD-10-CM

## 2013-09-27 DIAGNOSIS — F17200 Nicotine dependence, unspecified, uncomplicated: Secondary | ICD-10-CM | POA: Insufficient documentation

## 2013-09-27 DIAGNOSIS — H00019 Hordeolum externum unspecified eye, unspecified eyelid: Secondary | ICD-10-CM | POA: Insufficient documentation

## 2013-09-27 MED ORDER — BACITRACIN-POLYMYXIN B 500-10000 UNIT/GM OP OINT: 1.0000 "application " | TOPICAL_OINTMENT | Freq: Two times a day (BID) | OPHTHALMIC | Status: DC

## 2013-09-27 NOTE — ED Notes (Signed)
Pt states that Thursday had eye pain then did saline flushes and then grew to a point and then had drainage, now area is scabbed over. And pt wants to make sure his vision isnt going to be obstructed. Pt denies any visual deficits at this time.

## 2013-09-27 NOTE — Discharge Instructions (Signed)
Take the prescribed medication as directed.  Continue warm compresses to help aid drainage and healing. Follow-up with opthalmology, Dr. Burgess Estelleanner if you begin experiencing trouble with your vision or symptoms do not seem to be improving in the next few days. Return to the ED for new or worsening symptoms.

## 2013-09-27 NOTE — ED Provider Notes (Signed)
CSN: [id number]     Arrival date & time 09/27/13  1545 History  This chart was scribed for non-physician practitioner, Sharilyn Sites, PA-C working with Rolan Bucco, MD by Luisa Dago, ED scribe. This patient was seen in room WTR5/WTR5 and the patient's care was started at 4:44 PM.   Chief Complaint  Patient presents with  . Eye Pain   The history is provided by the patient. No language interpreter was used.   HPI Comments: Gregory QUEBEDEAUX is a 43 y.o. male who presents to the Emergency Department complaining of what the thinks is a stye under his right eye for the past 5 days.  Pt states initially started as a raised red lesion, progressively increasing in size.  Pt states he irrigated it in the shower with warm water and gently pressed it with expulsion of some purulent material.  Pt states he has continued doing this daily, however his eye has still yet to heal completely.  Now he has some crusting of the lesion.  Denies orbital pain, pain with EOM, or visual disturbance.  Pt does not wear glasses or contacts.  No topical medication tried.  Pt does not have an opthalamologist at this time.  Past Medical History  Diagnosis Date  . Opiate addiction    No past surgical history on file. No family history on file. History  Substance Use Topics  . Smoking status: Current Every Day Smoker -- 0.50 packs/day    Types: Cigarettes  . Smokeless tobacco: Not on file  . Alcohol Use: Yes     Comment: as often as possible but my family prevents me from drinking most of the time    Review of Systems  Eyes: Positive for pain (right eye). Negative for visual disturbance.  All other systems reviewed and are negative.    Allergies  Review of patient's allergies indicates no known allergies.  Home Medications   Current Outpatient Rx  Name  Route  Sig  Dispense  Refill  . ALPRAZolam (XANAX) 1 MG tablet   Oral   Take 0.5 mg by mouth daily as needed for anxiety. Anxiety.         .  buprenorphine-naloxone (SUBOXONE) 8-2 MG SUBL   Sublingual   Place 1 tablet under the tongue 3 (three) times daily.          Marland Kitchen ibuprofen (ADVIL,MOTRIN) 200 MG tablet   Oral   Take 400 mg by mouth every 6 (six) hours as needed for mild pain or moderate pain.          Triage Vitals:BP 112/64  Pulse 68  Temp(Src) 98.2 F (36.8 C) (Oral)  Resp 17  SpO2 99%  Physical Exam  Nursing note and vitals reviewed. Constitutional: He is oriented to person, place, and time. He appears well-developed and well-nourished. No distress.  HENT:  Head: Normocephalic and atraumatic.  Eyes: Conjunctivae and EOM are normal. Pupils are equal, round, and reactive to light. Right eye exhibits hordeolum. Right conjunctiva is not injected. Right conjunctiva has no hemorrhage. Right eye exhibits normal extraocular motion.    Stye along right lower medial lid margin, some crusting but not active drainage or bleeding; no diffuse lid edema; EOM intact and non-painful Pterygium of left lateral eye  Neck: Normal range of motion.  Cardiovascular: Normal rate, regular rhythm and normal heart sounds.   Pulmonary/Chest: Effort normal and breath sounds normal. No respiratory distress. He has no wheezes.  Abdominal: Soft. Bowel sounds are normal.  Musculoskeletal: Normal  range of motion.  Neurological: He is alert and oriented to person, place, and time.  Skin: Skin is warm and dry. He is not diaphoretic.  Psychiatric: He has a normal mood and affect.    ED Course  Procedures (including critical care time)  DIAGNOSTIC STUDIES: Oxygen Saturation is 99% on RA, normal by my interpretation.    COORDINATION OF CARE: 4:50 PM- Pt advised of plan for treatment and pt agrees.  Labs Review Labs Reviewed - No data to display Imaging Review No results found.  EKG Interpretation   None       MDM   1. Stye, right    Pt with stye of right lower medial eyelid, no active drainage at this time but some crusting  from prior drainage-- possible superimposed infection.  Pt denies visual disturbance.  Recommended fluorescein stain, pt declined.  Will start on polysporin ointment.  FU with opthalmology if problems occur.  Discussed plan with pt, they agreed.  Return precautions advised.  I personally performed the services described in this documentation, which was scribed in my presence. The recorded information has been reviewed and is accurate.   Garlon HatchetLisa M Nancyann Cotterman, PA-C 09/27/13 1718

## 2013-09-27 NOTE — ED Provider Notes (Signed)
Medical screening examination/treatment/procedure(s) were performed by non-physician practitioner and as supervising physician I was immediately available for consultation/collaboration.  EKG Interpretation   None         Nguyet Mercer, MD 09/27/13 1956 

## 2015-01-26 ENCOUNTER — Other Ambulatory Visit: Payer: Self-pay | Admitting: Family Medicine

## 2015-01-26 DIAGNOSIS — G4482 Headache associated with sexual activity: Secondary | ICD-10-CM

## 2015-05-06 ENCOUNTER — Emergency Department (HOSPITAL_COMMUNITY)
Admission: EM | Admit: 2015-05-06 | Discharge: 2015-05-06 | Disposition: A | Discharge status: Home / Self Care | Payer: BLUE CROSS/BLUE SHIELD | Attending: Emergency Medicine | Admitting: Emergency Medicine

## 2015-05-06 ENCOUNTER — Encounter (HOSPITAL_COMMUNITY): Payer: Self-pay | Admitting: *Deleted

## 2015-05-06 DIAGNOSIS — Z008 Encounter for other general examination: Secondary | ICD-10-CM | POA: Diagnosis present

## 2015-05-06 DIAGNOSIS — R4586 Emotional lability: Secondary | ICD-10-CM

## 2015-05-06 DIAGNOSIS — Z720 Tobacco use: Secondary | ICD-10-CM | POA: Diagnosis not present

## 2015-05-06 DIAGNOSIS — R454 Irritability and anger: Secondary | ICD-10-CM

## 2015-05-06 DIAGNOSIS — F99 Mental disorder, not otherwise specified: Secondary | ICD-10-CM

## 2015-05-06 DIAGNOSIS — Z79899 Other long term (current) drug therapy: Secondary | ICD-10-CM | POA: Diagnosis not present

## 2015-05-06 DIAGNOSIS — F348 Other persistent mood [affective] disorders: Secondary | ICD-10-CM | POA: Insufficient documentation

## 2015-05-06 LAB — RAPID URINE DRUG SCREEN, HOSP PERFORMED
Amphetamines: NOT DETECTED
Barbiturates: NOT DETECTED
Benzodiazepines: POSITIVE — AB
Cocaine: NOT DETECTED
OPIATES: NOT DETECTED
Tetrahydrocannabinol: POSITIVE — AB

## 2015-05-06 LAB — CBC
HEMATOCRIT: 44.3 % (ref 39.0–52.0)
Hemoglobin: 14.9 g/dL (ref 13.0–17.0)
MCH: 33 pg (ref 26.0–34.0)
MCHC: 33.6 g/dL (ref 30.0–36.0)
MCV: 98 fL (ref 78.0–100.0)
PLATELETS: 224 10*3/uL (ref 150–400)
RBC: 4.52 MIL/uL (ref 4.22–5.81)
RDW: 12.3 % (ref 11.5–15.5)
WBC: 8.1 10*3/uL (ref 4.0–10.5)

## 2015-05-06 LAB — BASIC METABOLIC PANEL
Anion gap: 7 (ref 5–15)
BUN: 12 mg/dL (ref 6–20)
CALCIUM: 9.5 mg/dL (ref 8.9–10.3)
CO2: 28 mmol/L (ref 22–32)
Chloride: 105 mmol/L (ref 101–111)
Creatinine, Ser: 1.03 mg/dL (ref 0.61–1.24)
GFR calc Af Amer: >60 mL/min (ref >60)
GFR calc non Af Amer: >60 mL/min (ref >60)
Glucose, Bld: 108 mg/dL — ABNORMAL HIGH (ref 65–99)
POTASSIUM: 4.3 mmol/L (ref 3.5–5.1)
Sodium: 140 mmol/L (ref 135–145)

## 2015-05-06 LAB — HEPATIC FUNCTION PANEL
ALBUMIN: 4.5 g/dL (ref 3.5–5.0)
ALK PHOS: 68 U/L (ref 38–126)
ALT: 19 U/L (ref 17–63)
AST: 22 U/L (ref 15–41)
BILIRUBIN TOTAL: 0.7 mg/dL (ref 0.3–1.2)
Bilirubin, Direct: <0.1 mg/dL — ABNORMAL LOW (ref 0.1–0.5)
Total Protein: 7.9 g/dL (ref 6.5–8.1)

## 2015-05-06 LAB — ETHANOL: Alcohol, Ethyl (B): <5 mg/dL (ref <5)

## 2015-05-06 LAB — LITHIUM LEVEL: LITHIUM LVL: 1.25 mmol/L — AB (ref 0.60–1.20)

## 2015-05-06 MED ORDER — ONDANSETRON HCL 4 MG PO TABS: 4.0000 mg | ORAL_TABLET | Freq: Three times a day (TID) | ORAL | Status: DC | PRN

## 2015-05-06 MED ORDER — LITHIUM CARBONATE ER 450 MG PO TBCR: 900.0000 mg | EXTENDED_RELEASE_TABLET | Freq: Every morning | ORAL | Status: DC

## 2015-05-06 MED ORDER — LORAZEPAM 1 MG PO TABS
1.0000 mg | ORAL_TABLET | Freq: Three times a day (TID) | ORAL | Status: DC | PRN
Start: 2015-05-06 — End: 2015-05-07

## 2015-05-06 MED ORDER — ALPRAZOLAM 0.5 MG PO TABS: 0.5000 mg | ORAL_TABLET | Freq: Every day | ORAL | Status: DC | PRN

## 2015-05-06 MED ORDER — ZOLPIDEM TARTRATE 5 MG PO TABS: 5.0000 mg | ORAL_TABLET | Freq: Every evening | ORAL | Status: DC | PRN

## 2015-05-06 MED ORDER — NICOTINE 21 MG/24HR TD PT24
21.0000 mg | MEDICATED_PATCH | Freq: Every day | TRANSDERMAL | Status: DC
  Administered 2015-05-06: 21 mg via TRANSDERMAL
  Filled 2015-05-06 (×2): qty 1

## 2015-05-06 MED ORDER — ACETAMINOPHEN 325 MG PO TABS: 650.0000 mg | ORAL_TABLET | ORAL | Status: DC | PRN

## 2015-05-06 MED ORDER — IBUPROFEN 200 MG PO TABS: 600.0000 mg | ORAL_TABLET | Freq: Three times a day (TID) | ORAL | Status: DC | PRN

## 2015-05-06 NOTE — ED Notes (Signed)
Pt. Requesting to go home. Dr. Adriana Simas consulted, will see patient.

## 2015-05-06 NOTE — ED Notes (Signed)
Pt. Noted in room. No complaints or concerns voiced. No distress or abnormal behavior noted. Will continue to monitor with security cameras. Q 15 minute rounds continue. 
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
## 2015-05-06 NOTE — ED Provider Notes (Signed)
CSN: [id number]     Arrival date & time 05/06/15  1227 History   First MD Initiated Contact with Patient 05/06/15 1324     Chief Complaint  Patient presents with  . Psychiatric Evaluation     (Consider location/radiation/quality/duration/timing/severity/associated sxs/prior Treatment) HPI   Pt with hx opiate addiction on suboxone, on lithium for mood stabilization, p/w 5 days of feeling emotionally uncontrolled.  States in relationship with his wife and children he has "no emotions," feels that he has been intolerant, often enraged, short with his wife and without empathy.  He feels he has been indifferent.  His wife and children left for the beach this week and he came to the hospital, was suddenly flooded with emotions.  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  Recognizes that he is losing his relationship and family and feels guilty and feels that he should be doing a better job in his role with the family.  Has been taking lithium for mood stabilization, also takes suboxone for prior opiate addiction.  He is concerned that his angry outbursts may be due to suboxone side effects and he is trying to wean himself of this. Denies SI, HI.    Past Medical History  Diagnosis Date  . Opiate addiction    History reviewed. No pertinent past surgical history. History reviewed. No pertinent family history. Social History  Substance Use Topics  . Smoking status: Current Every Day Smoker -- 0.50 packs/day    Types: Cigarettes  . Smokeless tobacco: None  . Alcohol Use: Yes     Comment: as often as possible but my family prevents me from drinking most of the time    Review of Systems  All other systems reviewed and are negative.     Allergies  Review of patient's allergies indicates no known allergies.  Home Medications   Prior to Admission medications   Medication Sig Start Date End Date Taking? Authorizing Provider  ALPRAZolam Prudy Feeler) 1 MG tablet Take 0.5 mg by mouth daily as needed for  anxiety. Anxiety.   Yes Historical Provider, MD  buprenorphine-naloxone (SUBOXONE) 8-2 MG SUBL Place 1 tablet under the tongue 3 (three) times daily.    Yes Historical Provider, MD  ibuprofen (ADVIL,MOTRIN) 200 MG tablet Take 800 mg by mouth every 6 (six) hours as needed for moderate pain.   Yes Historical Provider, MD  lithium carbonate (ESKALITH) 450 MG CR tablet Take 1,350 mg by mouth every morning.  04/16/15  Yes Historical Provider, MD  bacitracin-polymyxin b (POLYSPORIN) ophthalmic ointment Place 1 application into the right eye every 12 (twelve) hours. apply to eye every 12 hours while awake Patient not taking: Reported on 05/06/2015 09/27/13   Garlon Hatchet, PA-C   BP 144/80 mmHg  Pulse 71  Temp(Src) 98.9 F (37.2 C) (Oral)  Resp 18  SpO2 98% Physical Exam  Constitutional: He appears well-developed and well-nourished. No distress.  HENT:  Head: Normocephalic and atraumatic.  Neck: Neck supple.  Cardiovascular: Normal rate and regular rhythm.   Pulmonary/Chest: Effort normal and breath sounds normal. No respiratory distress. He has no wheezes. He has no rales.  Abdominal: Soft. He exhibits no distension and no mass. There is no tenderness. There is no rebound and no guarding.  Neurological: He is alert. He exhibits normal muscle tone.  Skin: He is not diaphoretic.  Psychiatric: His speech is normal and behavior is normal. He expresses no homicidal and no suicidal ideation.  Speaks calmly and assertively, expresses himself very well.  Occasionally tearful.   Nursing note and vitals reviewed.   ED Course  Procedures (including critical care time) Labs Review Labs Reviewed  BASIC METABOLIC PANEL - Abnormal; Notable for the following:    Glucose, Bld 108 (*)    All other components within normal limits  LITHIUM LEVEL - Abnormal; Notable for the following:    Lithium Lvl 1.25 (*)    All other components within normal limits  HEPATIC FUNCTION PANEL - Abnormal; Notable for the  following:    Bilirubin, Direct <0.1 (*)    All other components within normal limits  URINE RAPID DRUG SCREEN, HOSP PERFORMED - Abnormal; Notable for the following:    Benzodiazepines POSITIVE (*)    Tetrahydrocannabinol POSITIVE (*)    All other components within normal limits  CBC  ETHANOL    Imaging Review No results found. I, Hendryx Ricke, personally reviewed and evaluated these images and lab results as part of my medical decision-making.   EKG Interpretation None      MDM   Final diagnoses:  Psychiatric disturbance  Mood changes  Difficulty controlling anger    Patient on lithium for mood stabilization p/w emotional difficulties, uncontrolled anger, flattened emotions causing problems with his relationships.  Pt is voluntary, no SI or HI.  Pending TTS consult for recommendations.     Trixie Dredge, PA-C 05/06/15 1609  Lorre Nick, MD 05/07/15 [phone]

## 2015-05-06 NOTE — ED Notes (Addendum)
Pt states he would like a pyschiatric evaluation to see if he needs medication adjustment or inpatient treatment. Pt states he has felt overwhelmed due to responsibilities at home and work. Pt states he has had outbursts of anger with his wife and states he has no emotions for the past 5 days; pt states he feels this way every few months. Pt states he has been taking lithium regularly. Pt states he was prescribed risperidone but does not take it "because it makes me feel like a zombie". Pt denies SI/HI at this time. Pt states he occasionally smokes marijuana and drinks socially.

## 2015-05-06 NOTE — ED Notes (Signed)
Pt. Noted sleeping in room. No complaints or concerns voiced. No distress or abnormal behavior noted. Will continue to monitor with security cameras. Q 15 minute rounds continue. 

## 2015-05-06 NOTE — ED Notes (Signed)
Pt has been wanded by security. Remains with personal belongings at bedside until provider evaluation to determine POC.

## 2015-05-06 NOTE — Discharge Instructions (Signed)
Follow-up with community mental health resources °

## 2015-05-06 NOTE — ED Notes (Signed)
Per Irving Burton, [place], will leave pt in personal clothing with belongings as he has already been wanded and searched until TTS evaluates him.

## 2015-05-06 NOTE — ED Notes (Signed)
Dr. Adriana Simas talking to patient.

## 2015-05-06 NOTE — BH Assessment (Addendum)
Consulted with Claudette Head, DNP who recommends inpatient at this time based on patients acute stress and the patient stating that he does not fele safe at home. 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  EDP notified of disposition as well.   Davina Poke, LCSW Therapeutic Triage Specialist [facility] 05/06/2015 5:18 PM

## 2015-05-06 NOTE — ED Provider Notes (Signed)
No suicidal or homicidal ideation. No psychosis. Patient would prefer to work on his problems as an outpatient. Will DC home  Donnetta Hutching, MD 05/06/15 2258

## 2015-05-06 NOTE — ED Notes (Signed)
Report received from Gregory Rambo RN. Pt. Alert and oriented in no distress denies SI, HI, AVH and pain.  Pt. Instructed to come to me with problems or concerns.Will continue to monitor for safety via security cameras and Q 15 minute checks. 

## 2015-08-07 ENCOUNTER — Encounter (HOSPITAL_COMMUNITY): Payer: Self-pay | Admitting: Family Medicine

## 2015-08-07 ENCOUNTER — Emergency Department (HOSPITAL_COMMUNITY)
Admission: EM | Admit: 2015-08-07 | Discharge: 2015-08-07 | Disposition: A | Discharge status: Rehabilitation Facility | Payer: BLUE CROSS/BLUE SHIELD | Attending: Emergency Medicine | Admitting: Emergency Medicine

## 2015-08-07 DIAGNOSIS — R451 Restlessness and agitation: Secondary | ICD-10-CM | POA: Insufficient documentation

## 2015-08-07 DIAGNOSIS — Z79899 Other long term (current) drug therapy: Secondary | ICD-10-CM | POA: Diagnosis not present

## 2015-08-07 DIAGNOSIS — F3130 Bipolar disorder, current episode depressed, mild or moderate severity, unspecified: Secondary | ICD-10-CM | POA: Insufficient documentation

## 2015-08-07 DIAGNOSIS — Z008 Encounter for other general examination: Secondary | ICD-10-CM | POA: Diagnosis present

## 2015-08-07 DIAGNOSIS — F17210 Nicotine dependence, cigarettes, uncomplicated: Secondary | ICD-10-CM | POA: Insufficient documentation

## 2015-08-07 DIAGNOSIS — F1020 Alcohol dependence, uncomplicated: Secondary | ICD-10-CM | POA: Diagnosis present

## 2015-08-07 DIAGNOSIS — F419 Anxiety disorder, unspecified: Secondary | ICD-10-CM | POA: Insufficient documentation

## 2015-08-07 DIAGNOSIS — F1019 Alcohol abuse with unspecified alcohol-induced disorder: Secondary | ICD-10-CM | POA: Diagnosis not present

## 2015-08-07 DIAGNOSIS — F3160 Bipolar disorder, current episode mixed, unspecified: Secondary | ICD-10-CM | POA: Diagnosis present

## 2015-08-07 DIAGNOSIS — F1910 Other psychoactive substance abuse, uncomplicated: Secondary | ICD-10-CM

## 2015-08-07 HISTORY — DX: Anxiety disorder, unspecified: F41.9

## 2015-08-07 HISTORY — DX: Bipolar disorder, unspecified: F31.9

## 2015-08-07 LAB — CBC WITH DIFFERENTIAL/PLATELET
BASOS ABS: 0.1 10*3/uL (ref 0.0–0.1)
Basophils Relative: 1 %
Eosinophils Absolute: 0.2 10*3/uL (ref 0.0–0.7)
Eosinophils Relative: 2 %
HEMATOCRIT: 44.5 % (ref 39.0–52.0)
HEMOGLOBIN: 15.3 g/dL (ref 13.0–17.0)
LYMPHS PCT: 23 %
Lymphs Abs: 2.8 10*3/uL (ref 0.7–4.0)
MCH: 32.8 pg (ref 26.0–34.0)
MCHC: 34.4 g/dL (ref 30.0–36.0)
MCV: 95.5 fL (ref 78.0–100.0)
Monocytes Absolute: 1.1 10*3/uL — ABNORMAL HIGH (ref 0.1–1.0)
Monocytes Relative: 9 %
NEUTROS ABS: 8.1 10*3/uL — AB (ref 1.7–7.7)
NEUTROS PCT: 65 %
Platelets: 262 10*3/uL (ref 150–400)
RBC: 4.66 MIL/uL (ref 4.22–5.81)
RDW: 12.6 % (ref 11.5–15.5)
WBC: 12.3 10*3/uL — AB (ref 4.0–10.5)

## 2015-08-07 LAB — SALICYLATE LEVEL

## 2015-08-07 LAB — COMPREHENSIVE METABOLIC PANEL
ALBUMIN: 4.6 g/dL (ref 3.5–5.0)
ALT: 21 U/L (ref 17–63)
AST: 21 U/L (ref 15–41)
Alkaline Phosphatase: 57 U/L (ref 38–126)
Anion gap: 12 (ref 5–15)
BILIRUBIN TOTAL: 0.9 mg/dL (ref 0.3–1.2)
BUN: 10 mg/dL (ref 6–20)
CO2: 23 mmol/L (ref 22–32)
CREATININE: 0.92 mg/dL (ref 0.61–1.24)
Calcium: 9.4 mg/dL (ref 8.9–10.3)
Chloride: 100 mmol/L — ABNORMAL LOW (ref 101–111)
GFR calc Af Amer: >60 mL/min (ref >60)
GLUCOSE: 93 mg/dL (ref 65–99)
Potassium: 3.3 mmol/L — ABNORMAL LOW (ref 3.5–5.1)
Sodium: 135 mmol/L (ref 135–145)
TOTAL PROTEIN: 8 g/dL (ref 6.5–8.1)

## 2015-08-07 LAB — ETHANOL: Alcohol, Ethyl (B): 105 mg/dL — ABNORMAL HIGH (ref <5)

## 2015-08-07 LAB — ACETAMINOPHEN LEVEL: Acetaminophen (Tylenol), Serum: <10 ug/mL — ABNORMAL LOW (ref 10–30)

## 2015-08-07 LAB — LITHIUM LEVEL: LITHIUM LVL: 0.19 mmol/L — AB (ref 0.60–1.20)

## 2015-08-07 MED ORDER — ZOLPIDEM TARTRATE 5 MG PO TABS: 5.0000 mg | ORAL_TABLET | Freq: Every evening | ORAL | Status: DC | PRN

## 2015-08-07 MED ORDER — CHLORDIAZEPOXIDE HCL 25 MG PO CAPS
25.0000 mg | ORAL_CAPSULE | Freq: Four times a day (QID) | ORAL | Status: DC
  Administered 2015-08-07: 25 mg via ORAL
  Filled 2015-08-07: qty 1

## 2015-08-07 MED ORDER — IBUPROFEN 200 MG PO TABS: 600.0000 mg | ORAL_TABLET | Freq: Three times a day (TID) | ORAL | Status: DC | PRN

## 2015-08-07 MED ORDER — VITAMIN B-1 100 MG PO TABS
100.0000 mg | ORAL_TABLET | Freq: Every day | ORAL | Status: DC
  Administered 2015-08-07: 100 mg via ORAL
  Filled 2015-08-07: qty 1

## 2015-08-07 MED ORDER — LORAZEPAM 1 MG PO TABS
0.0000 mg | ORAL_TABLET | Freq: Four times a day (QID) | ORAL | Status: DC
  Administered 2015-08-07: 2 mg via ORAL
  Filled 2015-08-07: qty 2

## 2015-08-07 MED ORDER — ADULT MULTIVITAMIN W/MINERALS CH
1.0000 | ORAL_TABLET | Freq: Every day | ORAL | Status: DC
  Administered 2015-08-07: 1 via ORAL
  Filled 2015-08-07: qty 1

## 2015-08-07 MED ORDER — ONDANSETRON HCL 4 MG PO TABS: 4.0000 mg | ORAL_TABLET | Freq: Three times a day (TID) | ORAL | Status: DC | PRN

## 2015-08-07 MED ORDER — CHLORDIAZEPOXIDE HCL 25 MG PO CAPS: 25.0000 mg | ORAL_CAPSULE | Freq: Every day | ORAL | Status: DC

## 2015-08-07 MED ORDER — CHLORDIAZEPOXIDE HCL 25 MG PO CAPS: 25.0000 mg | ORAL_CAPSULE | Freq: Four times a day (QID) | ORAL | Status: DC | PRN

## 2015-08-07 MED ORDER — NICOTINE 21 MG/24HR TD PT24
21.0000 mg | MEDICATED_PATCH | Freq: Once | TRANSDERMAL | Status: DC
  Administered 2015-08-07: 21 mg via TRANSDERMAL
  Filled 2015-08-07: qty 1

## 2015-08-07 MED ORDER — LITHIUM CARBONATE ER 450 MG PO TBCR
900.0000 mg | EXTENDED_RELEASE_TABLET | Freq: Every morning | ORAL | Status: DC
  Administered 2015-08-07: 900 mg via ORAL
  Filled 2015-08-07: qty 2

## 2015-08-07 MED ORDER — THIAMINE HCL 100 MG/ML IJ SOLN: 100.0000 mg | Freq: Once | INTRAMUSCULAR | Status: DC

## 2015-08-07 MED ORDER — CLONAZEPAM 0.5 MG PO TABS: 0.5000 mg | ORAL_TABLET | Freq: Two times a day (BID) | ORAL | Status: DC

## 2015-08-07 MED ORDER — POTASSIUM CHLORIDE CRYS ER 20 MEQ PO TBCR
40.0000 meq | EXTENDED_RELEASE_TABLET | Freq: Once | ORAL | Status: AC
  Administered 2015-08-07: 40 meq via ORAL
  Filled 2015-08-07: qty 2

## 2015-08-07 MED ORDER — LOPERAMIDE HCL 2 MG PO CAPS: 2.0000 mg | ORAL_CAPSULE | ORAL | Status: DC | PRN

## 2015-08-07 MED ORDER — CHLORDIAZEPOXIDE HCL 25 MG PO CAPS: 25.0000 mg | ORAL_CAPSULE | ORAL | Status: DC

## 2015-08-07 MED ORDER — ALPRAZOLAM 0.5 MG PO TABS: 0.5000 mg | ORAL_TABLET | Freq: Every day | ORAL | Status: DC | PRN

## 2015-08-07 MED ORDER — LORAZEPAM 1 MG PO TABS: 0.0000 mg | ORAL_TABLET | Freq: Two times a day (BID) | ORAL | Status: DC

## 2015-08-07 MED ORDER — CHLORDIAZEPOXIDE HCL 25 MG PO CAPS: 25.0000 mg | ORAL_CAPSULE | Freq: Three times a day (TID) | ORAL | Status: DC

## 2015-08-07 MED ORDER — LORAZEPAM 1 MG PO TABS
1.0000 mg | ORAL_TABLET | Freq: Once | ORAL | Status: AC
  Administered 2015-08-07: 1 mg via ORAL
  Filled 2015-08-07: qty 1

## 2015-08-07 MED ORDER — ONDANSETRON 4 MG PO TBDP: 4.0000 mg | ORAL_TABLET | Freq: Four times a day (QID) | ORAL | Status: DC | PRN

## 2015-08-07 MED ORDER — HYDROXYZINE HCL 25 MG PO TABS: 25.0000 mg | ORAL_TABLET | Freq: Four times a day (QID) | ORAL | Status: DC | PRN

## 2015-08-07 MED ORDER — LITHIUM CARBONATE ER 450 MG PO TBCR: 900.0000 mg | EXTENDED_RELEASE_TABLET | Freq: Every morning | ORAL | Status: DC

## 2015-08-07 MED ORDER — ACETAMINOPHEN 325 MG PO TABS: 650.0000 mg | ORAL_TABLET | ORAL | Status: DC | PRN

## 2015-08-07 NOTE — ED Notes (Signed)
Patient belongings: Manson PasseyBrown shoes, black socks, a pair blue jeans, brown belt, blue sweater, collared shirt, I-phone with white charger, black lighter, and cigarettes. All belongings placed in one bag. Pt's mother took his wallet, I-pad, and watch.

## 2015-08-07 NOTE — ED Notes (Signed)
[facility]  Left the unit ambulatory with his mother.  All belongings returned.  Denies thoughts of harm to self or others.
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
## 2015-08-07 NOTE — BH Assessment (Signed)
[facility] Assessment Progress Note  Pt's nurse reports that this pt claims to have [facility].  At 12:30 this Clinical research associatewriter [facility] and spoke to [facility].  He confirms that they have a bed for the pt and ask that he be transferred as close to 14:00 as possible.  Pt's nurse, Dawnaly, has been notified.  Doylene Canninghomas Aceyn Kathol, MA Triage Specialist [phone]
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
## 2015-08-07 NOTE — [facility] Suicide Risk Assessment (Signed)
Suicide Risk Assessment  Discharge Assessment   [facility] Discharge Suicide Risk Assessment   Demographic Factors:  Male and Caucasian  Total Time spent with patient: 45 minutes   Musculoskeletal: Strength & Muscle Tone: within normal limits Gait & Station: normal Patient leans: N/A  Psychiatric Specialty Exam: Review of Systems  Constitutional: Negative.   HENT: Negative.   Eyes: Negative.   Respiratory: Negative.   Cardiovascular: Negative.   Gastrointestinal: Negative.   Genitourinary: Negative.   Musculoskeletal: Negative.   Skin: Negative.   Neurological: Negative.   Endo/Heme/Allergies: Negative.   Psychiatric/Behavioral: Positive for substance abuse.    Blood pressure 143/94, pulse 80, temperature 98.6 F (37 C), temperature source Oral, resp. rate 16, height  (1.803 m), weight 77.111 kg (170 lb), SpO2 100 %.Body mass index is 23.72 kg/(m^2).  General Appearance: Casual  Eye Contact::  Good  Speech:  Normal Rate  Volume:  Normal  Mood:  Anxious  Affect:  Congruent  Thought Process:  Coherent  Orientation:  Full (Time, Place, and Person)  Thought Content:  WDL  Suicidal Thoughts:  No  Homicidal Thoughts:  No  Memory:  Immediate;   Good Recent;   Good Remote;   Good  Judgement:  Fair  Insight:  Fair  Psychomotor Activity:  Normal  Concentration:  Good  Recall:  Good  Fund of Knowledge:Good  Language: Good  Akathisia:  No  Handed:  Right  AIMS (if indicated):     Assets:  Communication Skills Desire for Improvement Financial Resources/Insurance Housing Intimacy Leisure Time Physical Health Resilience Social Support Talents/Skills Transportation Vocational/Educational  ADL's:  Intact  Cognition: WNL  Sleep:      Has this patient used any form of tobacco in the last 30 days? (Cigarettes, Smokeless Tobacco, Cigars, and/or Pipes) Yes, A prescription for an FDA-approved tobacco cessation medication was offered at discharge and the patient  refused  Mental Status Per Nursing Assessment::   On Admission:   Alcohol dependence  Current Mental Status by Physician: NA  Loss Factors: NA  Historical Factors: NA  Risk Reduction Factors:   Responsible for children under 73 years of age, Sense of responsibility to family, Employed, Living with another person, especially a relative and Positive social support  Continued Clinical Symptoms:  Anxiety, mild  Cognitive Features That Contribute To Risk:  None    Suicide Risk:  Minimal: No identifiable suicidal ideation.  Patients presenting with no risk factors but with morbid ruminations; may be classified as minimal risk based on the severity of the depressive symptoms  Principal Problem: Alcohol use disorder, severe, dependence [facility]) Discharge Diagnoses:  Patient Active Problem List   Diagnosis Date Noted  . Bipolar affective disorder, current episode mixed, without psychotic features (HCC) [F31.60] 08/07/2015    Priority: High  . Alcohol use disorder, severe, dependence (HCC) [F10.20] 08/07/2015    Priority: High    Follow-up Information    Follow up with Please use the resources provided to you in emergency room by case manager to assist with doctor for follow up  On 08/07/2015.   Contact information:   These BCBS resources provide possible primary care providers for BCBS providers within zip code [address]       Plan Of Care/Follow-up recommendations:  Activity:  as tolerated Diet:  heart healthy diet  Is patient on multiple antipsychotic therapies at discharge:  No   Has Patient had three or more failed trials of antipsychotic monotherapy by history:  No  Recommended Plan for Multiple  Antipsychotic Therapies: NA    Khrystyna Schwalm, PMH-NP 08/07/2015, 12:52 PM

## 2015-08-07 NOTE — BH Assessment (Signed)
Consulted Letta KocherIjeoma Nwaeze,NP who recommended inpatient treatment. Informed Antony MaduraKelly Humes, PA-C has been informed of the recommendation.
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
## 2015-08-07 NOTE — ED Notes (Signed)
TTS at bedside. 

## 2015-08-07 NOTE — Progress Notes (Addendum)
[facility] ED CM noted pt with coverage but no pcp listed Spoke with pt who confirms no pcp  [facility] ED CM spoke with pt on how to obtain an in network pcp with insurance coverage via the customer service number or web site  PT given lists x 3 for in network internal medicine, psychiatry and substance abuse program providers within zip code [phone] Placed on pt bed- he was on [facility] phone Cm reviewed ED level of care for crisis/emergent services and community pcp level of care to manage continuous or chronic medical concerns.  The pt voiced understanding CM encouraged pt and discussed pt's responsibility to verify with pt's insurance carrier that any recommended medical provider offered by any emergency room or a hospital provider is within the carrier's network. The pt voiced understanding

## 2015-08-07 NOTE — ED Notes (Signed)
Patient is coming for detox from [facility]. Pt denies being SI/HI. Pt was kicked off a [place] yesterday in [place] due to be drunk and yelling at spouse on the plane. Pt reports drinking at least 6 drinks a day.

## 2016-01-29 DIAGNOSIS — Z13220 Encounter for screening for lipoid disorders: Secondary | ICD-10-CM | POA: Diagnosis not present

## 2016-01-29 DIAGNOSIS — Z0000 Encounter for general adult medical examination without abnormal findings: Secondary | ICD-10-CM | POA: Diagnosis not present

## 2016-01-30 ENCOUNTER — Other Ambulatory Visit: Payer: Self-pay | Admitting: Family Medicine

## 2016-01-30 DIAGNOSIS — M5416 Radiculopathy, lumbar region: Secondary | ICD-10-CM

## 2016-01-30 DIAGNOSIS — M545 Low back pain: Secondary | ICD-10-CM

## 2016-02-01 DIAGNOSIS — H11153 Pinguecula, bilateral: Secondary | ICD-10-CM | POA: Diagnosis not present

## 2016-02-06 ENCOUNTER — Ambulatory Visit
Admission: RE | Admit: 2016-02-06 | Discharge: 2016-02-06 | Disposition: A | Discharge status: Home / Self Care | Payer: BLUE CROSS/BLUE SHIELD | Source: Ambulatory Visit | Attending: Family Medicine | Admitting: Family Medicine

## 2016-02-06 DIAGNOSIS — M545 Low back pain: Secondary | ICD-10-CM

## 2016-02-06 DIAGNOSIS — M5416 Radiculopathy, lumbar region: Secondary | ICD-10-CM

## 2016-02-06 DIAGNOSIS — M4806 Spinal stenosis, lumbar region: Secondary | ICD-10-CM | POA: Diagnosis not present

## 2016-02-21 DIAGNOSIS — I781 Nevus, non-neoplastic: Secondary | ICD-10-CM | POA: Diagnosis not present

## 2016-02-21 DIAGNOSIS — L258 Unspecified contact dermatitis due to other agents: Secondary | ICD-10-CM | POA: Diagnosis not present

## 2016-02-21 DIAGNOSIS — Z1283 Encounter for screening for malignant neoplasm of skin: Secondary | ICD-10-CM | POA: Diagnosis not present

## 2016-02-22 DIAGNOSIS — M549 Dorsalgia, unspecified: Secondary | ICD-10-CM | POA: Diagnosis not present

## 2016-02-22 DIAGNOSIS — M5416 Radiculopathy, lumbar region: Secondary | ICD-10-CM | POA: Diagnosis not present

## 2016-02-29 DIAGNOSIS — M25651 Stiffness of right hip, not elsewhere classified: Secondary | ICD-10-CM | POA: Diagnosis not present

## 2016-02-29 DIAGNOSIS — M6249 Contracture of muscle, multiple sites: Secondary | ICD-10-CM | POA: Diagnosis not present

## 2016-02-29 DIAGNOSIS — M2560 Stiffness of unspecified joint, not elsewhere classified: Secondary | ICD-10-CM | POA: Diagnosis not present

## 2016-02-29 DIAGNOSIS — M545 Low back pain: Secondary | ICD-10-CM | POA: Diagnosis not present

## 2016-03-06 DIAGNOSIS — S3992XA Unspecified injury of lower back, initial encounter: Secondary | ICD-10-CM | POA: Diagnosis not present

## 2016-03-08 DIAGNOSIS — M545 Low back pain: Secondary | ICD-10-CM | POA: Diagnosis not present

## 2016-03-08 DIAGNOSIS — M6249 Contracture of muscle, multiple sites: Secondary | ICD-10-CM | POA: Diagnosis not present

## 2016-03-08 DIAGNOSIS — M2560 Stiffness of unspecified joint, not elsewhere classified: Secondary | ICD-10-CM | POA: Diagnosis not present

## 2016-03-08 DIAGNOSIS — M25651 Stiffness of right hip, not elsewhere classified: Secondary | ICD-10-CM | POA: Diagnosis not present

## 2016-03-21 DIAGNOSIS — M25651 Stiffness of right hip, not elsewhere classified: Secondary | ICD-10-CM | POA: Diagnosis not present

## 2016-03-21 DIAGNOSIS — M2560 Stiffness of unspecified joint, not elsewhere classified: Secondary | ICD-10-CM | POA: Diagnosis not present

## 2016-03-21 DIAGNOSIS — M545 Low back pain: Secondary | ICD-10-CM | POA: Diagnosis not present

## 2016-03-21 DIAGNOSIS — M6249 Contracture of muscle, multiple sites: Secondary | ICD-10-CM | POA: Diagnosis not present

## 2016-03-29 DIAGNOSIS — M545 Low back pain: Secondary | ICD-10-CM | POA: Diagnosis not present

## 2016-03-29 DIAGNOSIS — M25651 Stiffness of right hip, not elsewhere classified: Secondary | ICD-10-CM | POA: Diagnosis not present

## 2016-03-29 DIAGNOSIS — M6249 Contracture of muscle, multiple sites: Secondary | ICD-10-CM | POA: Diagnosis not present

## 2016-03-29 DIAGNOSIS — M2560 Stiffness of unspecified joint, not elsewhere classified: Secondary | ICD-10-CM | POA: Diagnosis not present

## 2016-04-03 DIAGNOSIS — M545 Low back pain: Secondary | ICD-10-CM | POA: Diagnosis not present

## 2016-04-03 DIAGNOSIS — M25651 Stiffness of right hip, not elsewhere classified: Secondary | ICD-10-CM | POA: Diagnosis not present

## 2016-04-03 DIAGNOSIS — M6249 Contracture of muscle, multiple sites: Secondary | ICD-10-CM | POA: Diagnosis not present

## 2016-04-03 DIAGNOSIS — M2560 Stiffness of unspecified joint, not elsewhere classified: Secondary | ICD-10-CM | POA: Diagnosis not present

## 2016-07-17 DIAGNOSIS — F3112 Bipolar disorder, current episode manic without psychotic features, moderate: Secondary | ICD-10-CM | POA: Diagnosis not present

## 2016-10-25 DIAGNOSIS — F3173 Bipolar disorder, in partial remission, most recent episode manic: Secondary | ICD-10-CM | POA: Diagnosis not present

## 2016-10-30 DIAGNOSIS — F3112 Bipolar disorder, current episode manic without psychotic features, moderate: Secondary | ICD-10-CM | POA: Diagnosis not present

## 2016-12-05 DIAGNOSIS — D487 Neoplasm of uncertain behavior of other specified sites: Secondary | ICD-10-CM | POA: Diagnosis not present

## 2016-12-05 DIAGNOSIS — M5416 Radiculopathy, lumbar region: Secondary | ICD-10-CM | POA: Diagnosis not present

## 2016-12-20 DIAGNOSIS — M545 Low back pain: Secondary | ICD-10-CM | POA: Diagnosis not present

## 2016-12-27 DIAGNOSIS — M545 Low back pain: Secondary | ICD-10-CM | POA: Diagnosis not present

## 2016-12-31 DIAGNOSIS — M545 Low back pain: Secondary | ICD-10-CM | POA: Diagnosis not present

## 2017-01-02 DIAGNOSIS — M545 Low back pain: Secondary | ICD-10-CM | POA: Diagnosis not present

## 2017-01-15 DIAGNOSIS — M545 Low back pain: Secondary | ICD-10-CM | POA: Diagnosis not present

## 2017-01-21 DIAGNOSIS — D225 Melanocytic nevi of trunk: Secondary | ICD-10-CM | POA: Diagnosis not present

## 2017-01-21 DIAGNOSIS — H61001 Unspecified perichondritis of right external ear: Secondary | ICD-10-CM | POA: Diagnosis not present

## 2017-01-30 DIAGNOSIS — Z0000 Encounter for general adult medical examination without abnormal findings: Secondary | ICD-10-CM | POA: Diagnosis not present

## 2017-01-30 DIAGNOSIS — Z13220 Encounter for screening for lipoid disorders: Secondary | ICD-10-CM | POA: Diagnosis not present

## 2017-01-30 DIAGNOSIS — Z5181 Encounter for therapeutic drug level monitoring: Secondary | ICD-10-CM | POA: Diagnosis not present

## 2017-02-05 DIAGNOSIS — M545 Low back pain: Secondary | ICD-10-CM | POA: Diagnosis not present

## 2017-02-13 DIAGNOSIS — M545 Low back pain: Secondary | ICD-10-CM | POA: Diagnosis not present

## 2017-02-19 DIAGNOSIS — F4310 Post-traumatic stress disorder, unspecified: Secondary | ICD-10-CM | POA: Diagnosis not present

## 2017-02-20 DIAGNOSIS — M545 Low back pain: Secondary | ICD-10-CM | POA: Diagnosis not present

## 2017-02-27 DIAGNOSIS — M545 Low back pain: Secondary | ICD-10-CM | POA: Diagnosis not present

## 2017-03-06 DIAGNOSIS — M545 Low back pain: Secondary | ICD-10-CM | POA: Diagnosis not present

## 2017-03-13 DIAGNOSIS — M9903 Segmental and somatic dysfunction of lumbar region: Secondary | ICD-10-CM | POA: Diagnosis not present

## 2017-03-13 DIAGNOSIS — M9905 Segmental and somatic dysfunction of pelvic region: Secondary | ICD-10-CM | POA: Diagnosis not present

## 2017-03-13 DIAGNOSIS — M791 Myalgia: Secondary | ICD-10-CM | POA: Diagnosis not present

## 2017-03-13 DIAGNOSIS — M9902 Segmental and somatic dysfunction of thoracic region: Secondary | ICD-10-CM | POA: Diagnosis not present

## 2017-03-14 DIAGNOSIS — M545 Low back pain: Secondary | ICD-10-CM | POA: Diagnosis not present

## 2017-03-14 DIAGNOSIS — M5137 Other intervertebral disc degeneration, lumbosacral region: Secondary | ICD-10-CM | POA: Diagnosis not present

## 2017-03-14 DIAGNOSIS — G8929 Other chronic pain: Secondary | ICD-10-CM | POA: Diagnosis not present

## 2017-03-20 DIAGNOSIS — M791 Myalgia: Secondary | ICD-10-CM | POA: Diagnosis not present

## 2017-03-20 DIAGNOSIS — M9903 Segmental and somatic dysfunction of lumbar region: Secondary | ICD-10-CM | POA: Diagnosis not present

## 2017-03-20 DIAGNOSIS — M9905 Segmental and somatic dysfunction of pelvic region: Secondary | ICD-10-CM | POA: Diagnosis not present

## 2017-03-20 DIAGNOSIS — M9902 Segmental and somatic dysfunction of thoracic region: Secondary | ICD-10-CM | POA: Diagnosis not present

## 2017-04-01 DIAGNOSIS — F4310 Post-traumatic stress disorder, unspecified: Secondary | ICD-10-CM | POA: Diagnosis not present

## 2017-04-01 DIAGNOSIS — M9902 Segmental and somatic dysfunction of thoracic region: Secondary | ICD-10-CM | POA: Diagnosis not present

## 2017-04-01 DIAGNOSIS — M791 Myalgia: Secondary | ICD-10-CM | POA: Diagnosis not present

## 2017-04-01 DIAGNOSIS — M9903 Segmental and somatic dysfunction of lumbar region: Secondary | ICD-10-CM | POA: Diagnosis not present

## 2017-04-01 DIAGNOSIS — M9905 Segmental and somatic dysfunction of pelvic region: Secondary | ICD-10-CM | POA: Diagnosis not present

## 2017-04-10 DIAGNOSIS — M9903 Segmental and somatic dysfunction of lumbar region: Secondary | ICD-10-CM | POA: Diagnosis not present

## 2017-04-10 DIAGNOSIS — M9902 Segmental and somatic dysfunction of thoracic region: Secondary | ICD-10-CM | POA: Diagnosis not present

## 2017-04-10 DIAGNOSIS — M791 Myalgia: Secondary | ICD-10-CM | POA: Diagnosis not present

## 2017-04-10 DIAGNOSIS — M9905 Segmental and somatic dysfunction of pelvic region: Secondary | ICD-10-CM | POA: Diagnosis not present

## 2017-04-14 DIAGNOSIS — M9905 Segmental and somatic dysfunction of pelvic region: Secondary | ICD-10-CM | POA: Diagnosis not present

## 2017-04-14 DIAGNOSIS — M9903 Segmental and somatic dysfunction of lumbar region: Secondary | ICD-10-CM | POA: Diagnosis not present

## 2017-04-14 DIAGNOSIS — M9902 Segmental and somatic dysfunction of thoracic region: Secondary | ICD-10-CM | POA: Diagnosis not present

## 2017-04-14 DIAGNOSIS — M791 Myalgia: Secondary | ICD-10-CM | POA: Diagnosis not present

## 2017-04-16 DIAGNOSIS — F3112 Bipolar disorder, current episode manic without psychotic features, moderate: Secondary | ICD-10-CM | POA: Diagnosis not present

## 2017-04-17 DIAGNOSIS — M791 Myalgia: Secondary | ICD-10-CM | POA: Diagnosis not present

## 2017-04-17 DIAGNOSIS — M9902 Segmental and somatic dysfunction of thoracic region: Secondary | ICD-10-CM | POA: Diagnosis not present

## 2017-04-17 DIAGNOSIS — M9905 Segmental and somatic dysfunction of pelvic region: Secondary | ICD-10-CM | POA: Diagnosis not present

## 2017-04-17 DIAGNOSIS — M9903 Segmental and somatic dysfunction of lumbar region: Secondary | ICD-10-CM | POA: Diagnosis not present

## 2017-04-22 DIAGNOSIS — M9902 Segmental and somatic dysfunction of thoracic region: Secondary | ICD-10-CM | POA: Diagnosis not present

## 2017-04-22 DIAGNOSIS — M9903 Segmental and somatic dysfunction of lumbar region: Secondary | ICD-10-CM | POA: Diagnosis not present

## 2017-04-22 DIAGNOSIS — M791 Myalgia: Secondary | ICD-10-CM | POA: Diagnosis not present

## 2017-04-22 DIAGNOSIS — M9905 Segmental and somatic dysfunction of pelvic region: Secondary | ICD-10-CM | POA: Diagnosis not present

## 2017-05-13 DIAGNOSIS — F4310 Post-traumatic stress disorder, unspecified: Secondary | ICD-10-CM | POA: Diagnosis not present

## 2017-05-22 DIAGNOSIS — M791 Myalgia: Secondary | ICD-10-CM | POA: Diagnosis not present

## 2017-05-22 DIAGNOSIS — M9902 Segmental and somatic dysfunction of thoracic region: Secondary | ICD-10-CM | POA: Diagnosis not present

## 2017-05-22 DIAGNOSIS — M9903 Segmental and somatic dysfunction of lumbar region: Secondary | ICD-10-CM | POA: Diagnosis not present

## 2017-05-22 DIAGNOSIS — M9905 Segmental and somatic dysfunction of pelvic region: Secondary | ICD-10-CM | POA: Diagnosis not present

## 2017-06-03 DIAGNOSIS — M9902 Segmental and somatic dysfunction of thoracic region: Secondary | ICD-10-CM | POA: Diagnosis not present

## 2017-06-03 DIAGNOSIS — M791 Myalgia: Secondary | ICD-10-CM | POA: Diagnosis not present

## 2017-06-03 DIAGNOSIS — M9903 Segmental and somatic dysfunction of lumbar region: Secondary | ICD-10-CM | POA: Diagnosis not present

## 2017-06-03 DIAGNOSIS — M9905 Segmental and somatic dysfunction of pelvic region: Secondary | ICD-10-CM | POA: Diagnosis not present

## 2017-06-17 DIAGNOSIS — M9905 Segmental and somatic dysfunction of pelvic region: Secondary | ICD-10-CM | POA: Diagnosis not present

## 2017-06-17 DIAGNOSIS — M9903 Segmental and somatic dysfunction of lumbar region: Secondary | ICD-10-CM | POA: Diagnosis not present

## 2017-06-17 DIAGNOSIS — M9902 Segmental and somatic dysfunction of thoracic region: Secondary | ICD-10-CM | POA: Diagnosis not present

## 2017-06-17 DIAGNOSIS — M791 Myalgia: Secondary | ICD-10-CM | POA: Diagnosis not present

## 2017-06-24 DIAGNOSIS — M791 Myalgia, unspecified site: Secondary | ICD-10-CM | POA: Diagnosis not present

## 2017-06-24 DIAGNOSIS — M9903 Segmental and somatic dysfunction of lumbar region: Secondary | ICD-10-CM | POA: Diagnosis not present

## 2017-06-24 DIAGNOSIS — M9905 Segmental and somatic dysfunction of pelvic region: Secondary | ICD-10-CM | POA: Diagnosis not present

## 2017-06-24 DIAGNOSIS — M9902 Segmental and somatic dysfunction of thoracic region: Secondary | ICD-10-CM | POA: Diagnosis not present

## 2017-07-01 DIAGNOSIS — M791 Myalgia, unspecified site: Secondary | ICD-10-CM | POA: Diagnosis not present

## 2017-07-01 DIAGNOSIS — M9902 Segmental and somatic dysfunction of thoracic region: Secondary | ICD-10-CM | POA: Diagnosis not present

## 2017-07-01 DIAGNOSIS — F4310 Post-traumatic stress disorder, unspecified: Secondary | ICD-10-CM | POA: Diagnosis not present

## 2017-07-01 DIAGNOSIS — M9905 Segmental and somatic dysfunction of pelvic region: Secondary | ICD-10-CM | POA: Diagnosis not present

## 2017-07-01 DIAGNOSIS — M9903 Segmental and somatic dysfunction of lumbar region: Secondary | ICD-10-CM | POA: Diagnosis not present

## 2017-07-08 DIAGNOSIS — M9905 Segmental and somatic dysfunction of pelvic region: Secondary | ICD-10-CM | POA: Diagnosis not present

## 2017-07-08 DIAGNOSIS — M791 Myalgia, unspecified site: Secondary | ICD-10-CM | POA: Diagnosis not present

## 2017-07-08 DIAGNOSIS — M9902 Segmental and somatic dysfunction of thoracic region: Secondary | ICD-10-CM | POA: Diagnosis not present

## 2017-07-08 DIAGNOSIS — M9903 Segmental and somatic dysfunction of lumbar region: Secondary | ICD-10-CM | POA: Diagnosis not present

## 2017-07-15 DIAGNOSIS — R399 Unspecified symptoms and signs involving the genitourinary system: Secondary | ICD-10-CM | POA: Diagnosis not present

## 2017-08-05 DIAGNOSIS — M791 Myalgia, unspecified site: Secondary | ICD-10-CM | POA: Diagnosis not present

## 2017-08-05 DIAGNOSIS — M9905 Segmental and somatic dysfunction of pelvic region: Secondary | ICD-10-CM | POA: Diagnosis not present

## 2017-08-05 DIAGNOSIS — M9903 Segmental and somatic dysfunction of lumbar region: Secondary | ICD-10-CM | POA: Diagnosis not present

## 2017-08-05 DIAGNOSIS — M9902 Segmental and somatic dysfunction of thoracic region: Secondary | ICD-10-CM | POA: Diagnosis not present

## 2017-08-19 DIAGNOSIS — M791 Myalgia, unspecified site: Secondary | ICD-10-CM | POA: Diagnosis not present

## 2017-08-19 DIAGNOSIS — M9905 Segmental and somatic dysfunction of pelvic region: Secondary | ICD-10-CM | POA: Diagnosis not present

## 2017-08-19 DIAGNOSIS — M9902 Segmental and somatic dysfunction of thoracic region: Secondary | ICD-10-CM | POA: Diagnosis not present

## 2017-08-19 DIAGNOSIS — M9903 Segmental and somatic dysfunction of lumbar region: Secondary | ICD-10-CM | POA: Diagnosis not present

## 2017-08-26 DIAGNOSIS — M791 Myalgia, unspecified site: Secondary | ICD-10-CM | POA: Diagnosis not present

## 2017-08-26 DIAGNOSIS — M9905 Segmental and somatic dysfunction of pelvic region: Secondary | ICD-10-CM | POA: Diagnosis not present

## 2017-08-26 DIAGNOSIS — M9902 Segmental and somatic dysfunction of thoracic region: Secondary | ICD-10-CM | POA: Diagnosis not present

## 2017-08-26 DIAGNOSIS — F4310 Post-traumatic stress disorder, unspecified: Secondary | ICD-10-CM | POA: Diagnosis not present

## 2017-08-26 DIAGNOSIS — M9903 Segmental and somatic dysfunction of lumbar region: Secondary | ICD-10-CM | POA: Diagnosis not present

## 2017-09-02 DIAGNOSIS — M9905 Segmental and somatic dysfunction of pelvic region: Secondary | ICD-10-CM | POA: Diagnosis not present

## 2017-09-02 DIAGNOSIS — M9902 Segmental and somatic dysfunction of thoracic region: Secondary | ICD-10-CM | POA: Diagnosis not present

## 2017-09-02 DIAGNOSIS — M791 Myalgia, unspecified site: Secondary | ICD-10-CM | POA: Diagnosis not present

## 2017-09-02 DIAGNOSIS — M9903 Segmental and somatic dysfunction of lumbar region: Secondary | ICD-10-CM | POA: Diagnosis not present

## 2017-09-09 DIAGNOSIS — M9902 Segmental and somatic dysfunction of thoracic region: Secondary | ICD-10-CM | POA: Diagnosis not present

## 2017-09-09 DIAGNOSIS — M791 Myalgia, unspecified site: Secondary | ICD-10-CM | POA: Diagnosis not present

## 2017-09-09 DIAGNOSIS — M9905 Segmental and somatic dysfunction of pelvic region: Secondary | ICD-10-CM | POA: Diagnosis not present

## 2017-09-09 DIAGNOSIS — M9903 Segmental and somatic dysfunction of lumbar region: Secondary | ICD-10-CM | POA: Diagnosis not present

## 2017-09-17 DIAGNOSIS — M9905 Segmental and somatic dysfunction of pelvic region: Secondary | ICD-10-CM | POA: Diagnosis not present

## 2017-09-17 DIAGNOSIS — M9903 Segmental and somatic dysfunction of lumbar region: Secondary | ICD-10-CM | POA: Diagnosis not present

## 2017-09-17 DIAGNOSIS — M9902 Segmental and somatic dysfunction of thoracic region: Secondary | ICD-10-CM | POA: Diagnosis not present

## 2017-09-17 DIAGNOSIS — M791 Myalgia, unspecified site: Secondary | ICD-10-CM | POA: Diagnosis not present

## 2017-09-30 DIAGNOSIS — M9903 Segmental and somatic dysfunction of lumbar region: Secondary | ICD-10-CM | POA: Diagnosis not present

## 2017-09-30 DIAGNOSIS — M9902 Segmental and somatic dysfunction of thoracic region: Secondary | ICD-10-CM | POA: Diagnosis not present

## 2017-09-30 DIAGNOSIS — M9905 Segmental and somatic dysfunction of pelvic region: Secondary | ICD-10-CM | POA: Diagnosis not present

## 2017-09-30 DIAGNOSIS — M791 Myalgia, unspecified site: Secondary | ICD-10-CM | POA: Diagnosis not present

## 2017-10-01 DIAGNOSIS — F3112 Bipolar disorder, current episode manic without psychotic features, moderate: Secondary | ICD-10-CM | POA: Diagnosis not present

## 2017-10-06 DIAGNOSIS — R399 Unspecified symptoms and signs involving the genitourinary system: Secondary | ICD-10-CM | POA: Diagnosis not present

## 2017-10-10 DIAGNOSIS — M9902 Segmental and somatic dysfunction of thoracic region: Secondary | ICD-10-CM | POA: Diagnosis not present

## 2017-10-10 DIAGNOSIS — M791 Myalgia, unspecified site: Secondary | ICD-10-CM | POA: Diagnosis not present

## 2017-10-10 DIAGNOSIS — M9905 Segmental and somatic dysfunction of pelvic region: Secondary | ICD-10-CM | POA: Diagnosis not present

## 2017-10-10 DIAGNOSIS — M9903 Segmental and somatic dysfunction of lumbar region: Secondary | ICD-10-CM | POA: Diagnosis not present

## 2017-10-14 DIAGNOSIS — M9903 Segmental and somatic dysfunction of lumbar region: Secondary | ICD-10-CM | POA: Diagnosis not present

## 2017-10-14 DIAGNOSIS — M9902 Segmental and somatic dysfunction of thoracic region: Secondary | ICD-10-CM | POA: Diagnosis not present

## 2017-10-14 DIAGNOSIS — M791 Myalgia, unspecified site: Secondary | ICD-10-CM | POA: Diagnosis not present

## 2017-10-14 DIAGNOSIS — M9905 Segmental and somatic dysfunction of pelvic region: Secondary | ICD-10-CM | POA: Diagnosis not present

## 2017-10-21 DIAGNOSIS — M9902 Segmental and somatic dysfunction of thoracic region: Secondary | ICD-10-CM | POA: Diagnosis not present

## 2017-10-21 DIAGNOSIS — M791 Myalgia, unspecified site: Secondary | ICD-10-CM | POA: Diagnosis not present

## 2017-10-21 DIAGNOSIS — M9903 Segmental and somatic dysfunction of lumbar region: Secondary | ICD-10-CM | POA: Diagnosis not present

## 2017-10-21 DIAGNOSIS — M9905 Segmental and somatic dysfunction of pelvic region: Secondary | ICD-10-CM | POA: Diagnosis not present

## 2018-02-03 DIAGNOSIS — Z0000 Encounter for general adult medical examination without abnormal findings: Secondary | ICD-10-CM | POA: Diagnosis not present

## 2018-02-03 DIAGNOSIS — R399 Unspecified symptoms and signs involving the genitourinary system: Secondary | ICD-10-CM | POA: Diagnosis not present

## 2018-02-03 DIAGNOSIS — Z13220 Encounter for screening for lipoid disorders: Secondary | ICD-10-CM | POA: Diagnosis not present

## 2018-03-02 DIAGNOSIS — M5416 Radiculopathy, lumbar region: Secondary | ICD-10-CM | POA: Diagnosis not present

## 2018-03-02 DIAGNOSIS — G8929 Other chronic pain: Secondary | ICD-10-CM | POA: Diagnosis not present

## 2018-03-05 ENCOUNTER — Other Ambulatory Visit: Payer: Self-pay | Admitting: Physical Medicine and Rehabilitation

## 2018-03-05 DIAGNOSIS — M5416 Radiculopathy, lumbar region: Secondary | ICD-10-CM

## 2018-03-09 ENCOUNTER — Other Ambulatory Visit: Payer: Self-pay

## 2018-03-11 ENCOUNTER — Ambulatory Visit
Admission: RE | Admit: 2018-03-11 | Discharge: 2018-03-11 | Disposition: A | Discharge status: Home / Self Care | Payer: BLUE CROSS/BLUE SHIELD | Source: Ambulatory Visit | Attending: Physical Medicine and Rehabilitation | Admitting: Physical Medicine and Rehabilitation

## 2018-03-11 DIAGNOSIS — M545 Low back pain: Secondary | ICD-10-CM | POA: Diagnosis not present

## 2018-03-11 DIAGNOSIS — M5416 Radiculopathy, lumbar region: Secondary | ICD-10-CM

## 2018-03-18 DIAGNOSIS — F3112 Bipolar disorder, current episode manic without psychotic features, moderate: Secondary | ICD-10-CM | POA: Diagnosis not present

## 2018-03-19 DIAGNOSIS — F3173 Bipolar disorder, in partial remission, most recent episode manic: Secondary | ICD-10-CM | POA: Diagnosis not present

## 2018-03-20 DIAGNOSIS — N401 Enlarged prostate with lower urinary tract symptoms: Secondary | ICD-10-CM | POA: Diagnosis not present

## 2018-03-20 DIAGNOSIS — N3943 Post-void dribbling: Secondary | ICD-10-CM | POA: Diagnosis not present

## 2018-03-20 DIAGNOSIS — R3912 Poor urinary stream: Secondary | ICD-10-CM | POA: Diagnosis not present

## 2018-03-20 DIAGNOSIS — R350 Frequency of micturition: Secondary | ICD-10-CM | POA: Diagnosis not present

## 2018-05-08 DIAGNOSIS — N3943 Post-void dribbling: Secondary | ICD-10-CM | POA: Diagnosis not present

## 2018-05-08 DIAGNOSIS — N401 Enlarged prostate with lower urinary tract symptoms: Secondary | ICD-10-CM | POA: Diagnosis not present

## 2018-05-20 DIAGNOSIS — M545 Low back pain: Secondary | ICD-10-CM | POA: Diagnosis not present

## 2018-05-20 DIAGNOSIS — M62838 Other muscle spasm: Secondary | ICD-10-CM | POA: Diagnosis not present

## 2018-05-20 DIAGNOSIS — M6281 Muscle weakness (generalized): Secondary | ICD-10-CM | POA: Diagnosis not present

## 2018-05-20 DIAGNOSIS — N3943 Post-void dribbling: Secondary | ICD-10-CM | POA: Diagnosis not present

## 2018-05-27 DIAGNOSIS — M549 Dorsalgia, unspecified: Secondary | ICD-10-CM | POA: Diagnosis not present

## 2018-05-27 DIAGNOSIS — M545 Low back pain: Secondary | ICD-10-CM | POA: Diagnosis not present

## 2018-05-27 DIAGNOSIS — M5136 Other intervertebral disc degeneration, lumbar region: Secondary | ICD-10-CM | POA: Diagnosis not present

## 2018-05-27 DIAGNOSIS — M5416 Radiculopathy, lumbar region: Secondary | ICD-10-CM | POA: Diagnosis not present

## 2018-06-04 DIAGNOSIS — M5416 Radiculopathy, lumbar region: Secondary | ICD-10-CM | POA: Diagnosis not present

## 2018-06-09 DIAGNOSIS — M6281 Muscle weakness (generalized): Secondary | ICD-10-CM | POA: Diagnosis not present

## 2018-06-09 DIAGNOSIS — N3943 Post-void dribbling: Secondary | ICD-10-CM | POA: Diagnosis not present

## 2018-06-09 DIAGNOSIS — M62838 Other muscle spasm: Secondary | ICD-10-CM | POA: Diagnosis not present

## 2018-06-09 DIAGNOSIS — R3912 Poor urinary stream: Secondary | ICD-10-CM | POA: Diagnosis not present

## 2018-06-24 DIAGNOSIS — M6281 Muscle weakness (generalized): Secondary | ICD-10-CM | POA: Diagnosis not present

## 2018-06-24 DIAGNOSIS — M62838 Other muscle spasm: Secondary | ICD-10-CM | POA: Diagnosis not present

## 2018-06-24 DIAGNOSIS — N3943 Post-void dribbling: Secondary | ICD-10-CM | POA: Diagnosis not present

## 2018-06-24 DIAGNOSIS — R350 Frequency of micturition: Secondary | ICD-10-CM | POA: Diagnosis not present

## 2018-07-07 DIAGNOSIS — M792 Neuralgia and neuritis, unspecified: Secondary | ICD-10-CM | POA: Diagnosis not present

## 2018-07-07 DIAGNOSIS — M549 Dorsalgia, unspecified: Secondary | ICD-10-CM | POA: Diagnosis not present

## 2018-07-07 DIAGNOSIS — M5416 Radiculopathy, lumbar region: Secondary | ICD-10-CM | POA: Diagnosis not present

## 2018-07-07 DIAGNOSIS — M5136 Other intervertebral disc degeneration, lumbar region: Secondary | ICD-10-CM | POA: Diagnosis not present

## 2018-07-14 DIAGNOSIS — M549 Dorsalgia, unspecified: Secondary | ICD-10-CM | POA: Diagnosis not present

## 2018-08-13 DIAGNOSIS — N3943 Post-void dribbling: Secondary | ICD-10-CM | POA: Diagnosis not present

## 2018-08-13 DIAGNOSIS — R3915 Urgency of urination: Secondary | ICD-10-CM | POA: Diagnosis not present

## 2018-08-13 DIAGNOSIS — N401 Enlarged prostate with lower urinary tract symptoms: Secondary | ICD-10-CM | POA: Diagnosis not present

## 2018-08-19 ENCOUNTER — Encounter: Payer: Self-pay | Admitting: Emergency Medicine

## 2018-09-02 ENCOUNTER — Ambulatory Visit: Payer: BLUE CROSS/BLUE SHIELD | Admitting: Psychiatry

## 2018-09-02 ENCOUNTER — Other Ambulatory Visit: Payer: Self-pay

## 2018-09-02 ENCOUNTER — Encounter: Payer: Self-pay | Admitting: Psychiatry

## 2018-09-02 DIAGNOSIS — F3176 Bipolar disorder, in full remission, most recent episode depressed: Secondary | ICD-10-CM | POA: Diagnosis not present

## 2018-09-02 DIAGNOSIS — F1021 Alcohol dependence, in remission: Secondary | ICD-10-CM

## 2018-09-02 NOTE — Progress Notes (Signed)
Gregory EllisJoshua A Hardy [id number] 19-Nov-1970 47 y.o.  Subjective:   Patient ID:  Gregory EllisJoshua A Hardy is a 47 y.o. (DOB 19-Nov-1970) male.  Chief Complaint:  Chief Complaint  Patient presents with  . Follow-up    Medication Management    HPI Gregory EllisJoshua A Hardy presents to the office today for follow-up of bipolar disorder.  Has been stable.  Celebrated 3 years of sobiriety.  Helped consistency in mood.  Heavy involvment in AA.  Better stress management.  Less anger.  Done ok with less lithium.  Uses CBT coping skills.  No mood swings of significance.  Volunteers at [employer].  Before last relapse  He had 10 years sobriety, differently this time better exercise and diet.  Lives in [place].   Review of Systems:  Review of Systems  Musculoskeletal: Positive for back pain.  Neurological: Negative for tremors and weakness.  Psychiatric/Behavioral: Negative for agitation, behavioral problems, confusion, decreased concentration, dysphoric mood, hallucinations, self-injury, sleep disturbance and suicidal ideas. The patient is not nervous/anxious and is not hyperactive.     Medications: I have reviewed the patient's current medications.  Current Outpatient Medications  Medication Sig Dispense Refill  . lithium carbonate (ESKALITH) 450 MG CR tablet Take 900 mg by mouth every morning.   0  . tamsulosin (FLOMAX) 0.4 MG CAPS capsule Take 0.4 mg by mouth daily.  11  . ibuprofen (ADVIL,MOTRIN) 200 MG tablet Take 800 mg by mouth every 6 (six) hours as needed for moderate pain.     No current facility-administered medications for this visit.     Medication Side Effects: None  Allergies: No Known Allergies  Past Medical History:  Diagnosis Date  . Anxiety   . Bipolar 1 disorder ([facility])   . Opiate addiction ([facility])     History reviewed. No pertinent family history.  Social History   Socioeconomic History  . Marital status: Married    Spouse name: Not on file  . Number of children: Not on  file  . Years of education: Not on file  . Highest education level: Not on file  Occupational History  . Not on file  Social Needs  . Financial resource strain: Not on file  . Food insecurity:    Worry: Not on file    Inability: Not on file  . Transportation needs:    Medical: Not on file    Non-medical: Not on file  Tobacco Use  . Smoking status: Current Every Day Smoker    Packs/day: 0.50    Types: E-cigarettes  . Smokeless tobacco: Never Used  Substance and Sexual Activity  . Alcohol use: Yes    Comment: At least 6 drinks daily. Last drink: 22:30 last night.   . Drug use: Yes    Types: Marijuana    Comment: History of heroin. Last used: "a few days". "Maybe Thursday or Friday of last week"  . Sexual activity: Not on file  Lifestyle  . Physical activity:    Days per week: Not on file    Minutes per session: Not on file  . Stress: Not on file  Relationships  . Social connections:    Talks on phone: Not on file    Gets together: Not on file    Attends religious service: Not on file    Active member of club or organization: Not on file    Attends meetings of clubs or organizations: Not on file    Relationship status: Not on file  . Intimate partner violence:  Fear of current or ex partner: Not on file    Emotionally abused: Not on file    Physically abused: Not on file    Forced sexual activity: Not on file  Other Topics Concern  . Not on file  Social History Narrative  . Not on file    Past Medical History, Surgical history, Social history, and Family history were reviewed and updated as appropriate.   Please see review of systems for further details on the patient's review from today.   Objective:   Physical Exam:  There were no vitals taken for this visit.  Physical Exam  Constitutional: He is oriented to person, place, and time. He appears well-developed. No distress.  Musculoskeletal: He exhibits no deformity.  Neurological: He is alert and oriented  to person, place, and time. He displays no tremor. Coordination and gait normal.  Psychiatric: He has a normal mood and affect. His speech is normal and behavior is normal. Judgment and thought content normal. His mood appears not anxious. His affect is not angry, not blunt, not labile and not inappropriate. Cognition and memory are normal. He does not exhibit a depressed mood. He expresses no homicidal and no suicidal ideation. He expresses no suicidal plans and no homicidal plans.  Insight intact. No auditory or visual hallucinations. No delusions.     Lab Review:     Component Value Date/Time   NA 135 08/07/2015 0339   K 3.3 (L) 08/07/2015 0339   CL 100 (L) 08/07/2015 0339   CO2 23 08/07/2015 0339   GLUCOSE 93 08/07/2015 0339   BUN 10 08/07/2015 0339   CREATININE 0.92 08/07/2015 0339   CALCIUM 9.4 08/07/2015 0339   PROT 8.0 08/07/2015 0339   ALBUMIN 4.6 08/07/2015 0339   AST 21 08/07/2015 0339   ALT 21 08/07/2015 0339   ALKPHOS 57 08/07/2015 0339   BILITOT 0.9 08/07/2015 0339   GFRNONAA >60 08/07/2015 0339   GFRAA >60 08/07/2015 0339       Component Value Date/Time   WBC 12.3 (H) 08/07/2015 0339   RBC 4.66 08/07/2015 0339   HGB 15.3 08/07/2015 0339   HCT 44.5 08/07/2015 0339   PLT 262 08/07/2015 0339   MCV 95.5 08/07/2015 0339   MCH 32.8 08/07/2015 0339   MCHC 34.4 08/07/2015 0339   RDW 12.6 08/07/2015 0339   LYMPHSABS 2.8 08/07/2015 0339   MONOABS 1.1 (H) 08/07/2015 0339   EOSABS 0.2 08/07/2015 0339   BASOSABS 0.1 08/07/2015 0339    Lithium Lvl  Date Value Ref Range Status  08/07/2015 0.19 (L) 0.60 - 1.20 mmol/L Final     No results found for: PHENYTOIN, PHENOBARB, VALPROATE, CBMZ   Last lithium 03/09/18 = 0.4 at 900 mg daily. Normal BMP and TSH.  .res Assessment: Plan:    Bipolar 1 disorder, depressed, full remission ([facility])  Alcohol dependence in remission ([facility])   Meds stable and mood and mood without problems.  Stable on low dose lithium.  Disc risks  with low dosage. Counseled patient regarding potential benefits, risks, and side effects of lithium to include potential risk of lithium affecting thyroid and renal function.  Discussed need for periodic lab monitoring to determine drug level and to assess for potential adverse effects.  Counseled patient regarding signs and symptoms of lithium toxicity and advised that they notify office immediately or seek urgent medical attention if experiencing these signs and symptoms.  Patient advised to contact office with any questions or concerns.   Check lithium level  FU 6 mos  Meredith Staggers, MD, DFAPA   Please see After Visit Summary for patient specific instructions.  No future appointments.  No orders of the defined types were placed in this encounter.     -------------------------------

## 2018-10-07 IMAGING — MR MR LUMBAR SPINE W/O CM
4 of 5 series · 26 of 48 positions shown · non-contrast
Comparison: 02/06/2016

CLINICAL DATA: Back pain with right leg pain and numbness

EXAM:
MRI LUMBAR SPINE WITHOUT CONTRAST
TECHNIQUE: Multiplanar, multisequence MR imaging of the lumbar spine was
performed. No intravenous contrast was administered.

[Series 3: T2 · sagittal · 4.0mm · 0.57mm/px · 6 of 17 slices shown (1 of 2)]
[im 1/17]
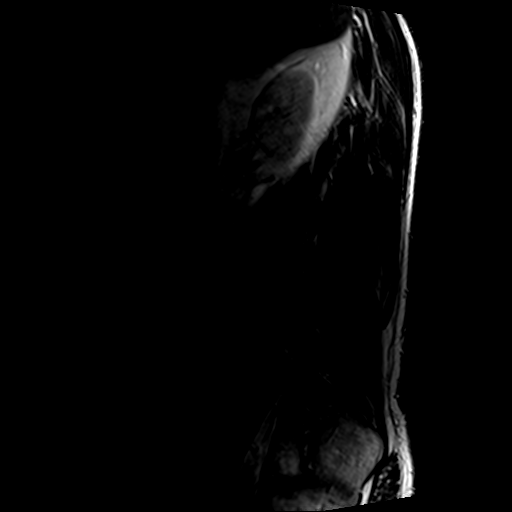
[im 4/17]
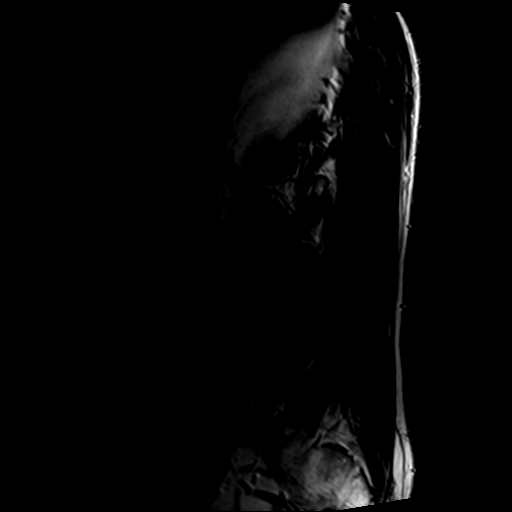
[im 7/17]
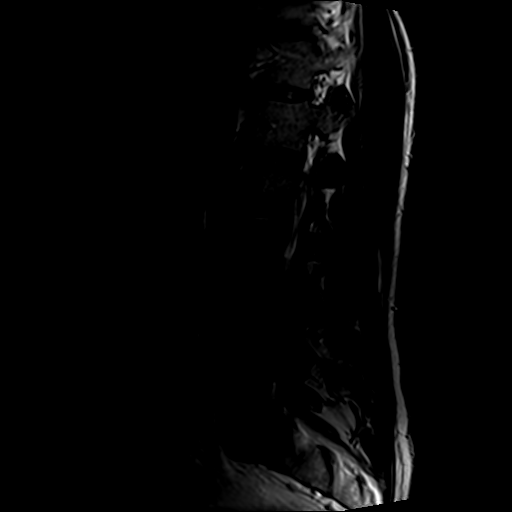
[im 10/17]
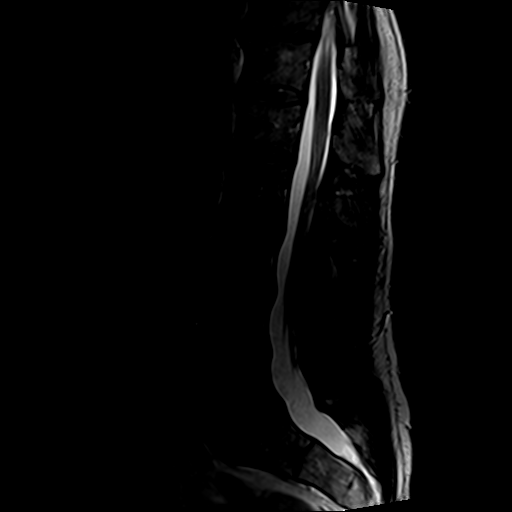
[im 13/17]
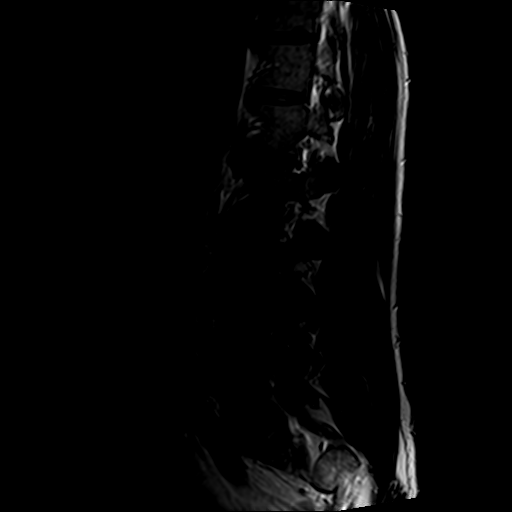
[im 17/17]
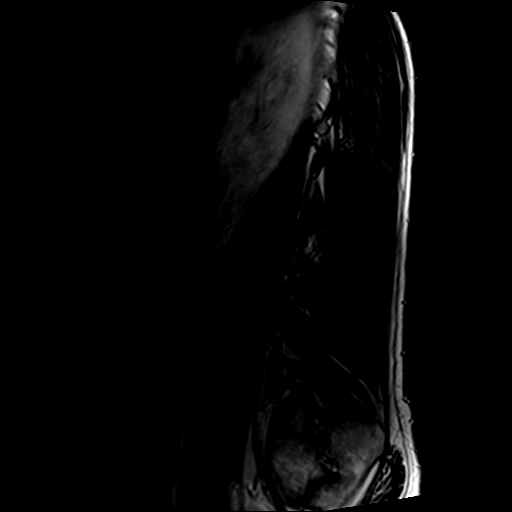

[Series 4: T1 · sagittal · 4.0mm · 0.57mm/px · 6 of 17 slices shown (1 of 2)]
[im 1/17]
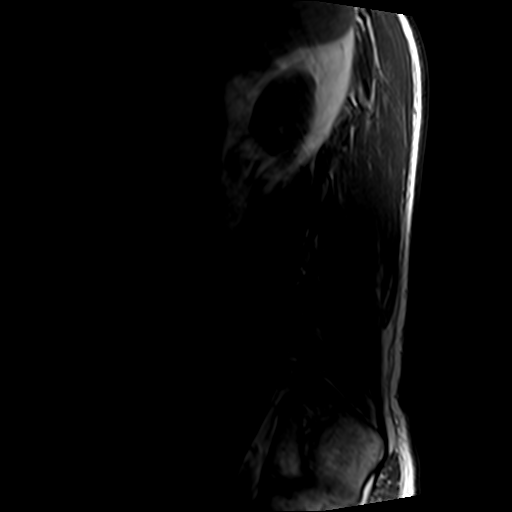
[im 4/17]
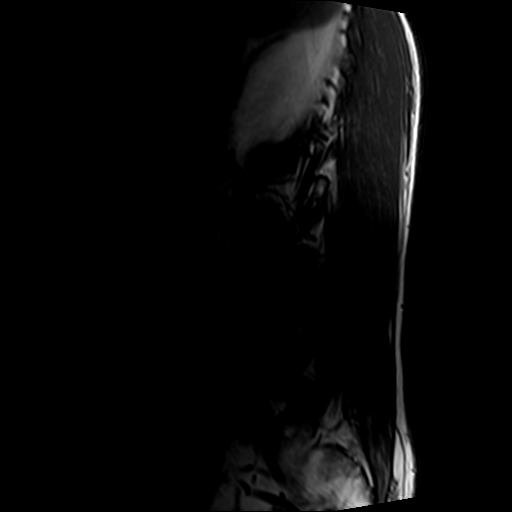
[im 7/17]
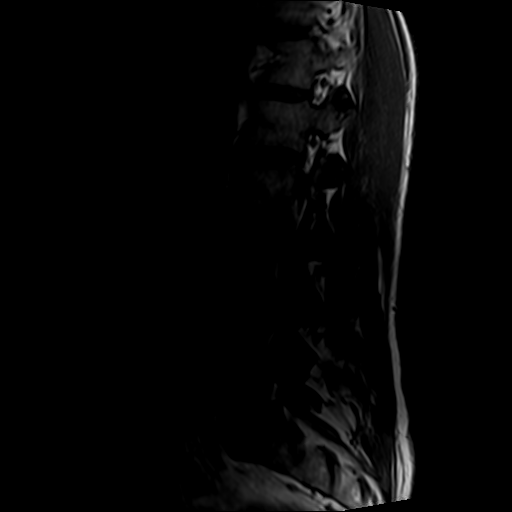
[im 10/17]
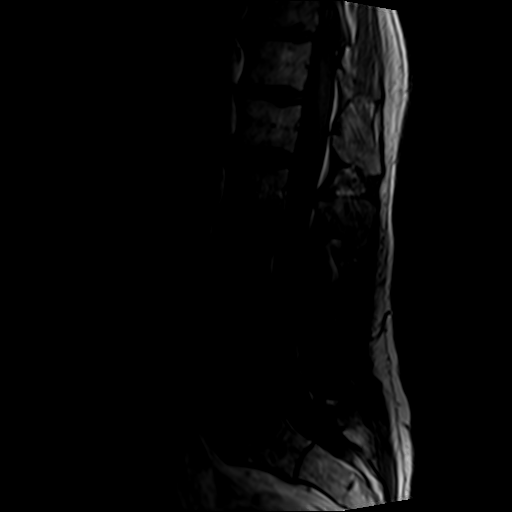
[im 13/17]
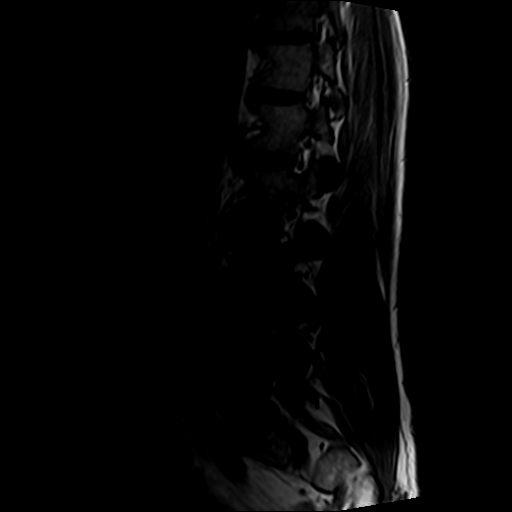
[im 17/17]
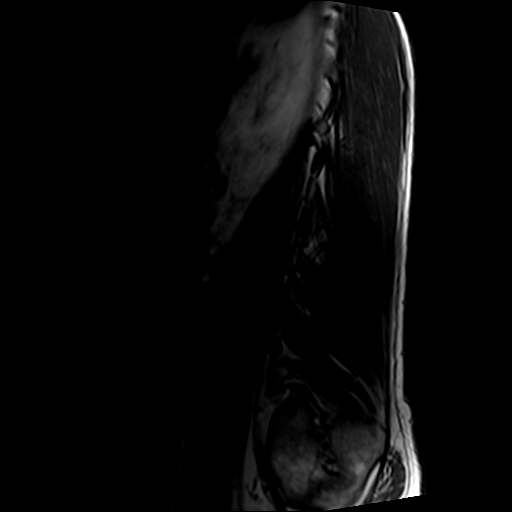

[Series 6: T2 · axial · 4.0mm · 0.70mm/px · z∈[-94,+133]mm · 9 of 43 slices shown (2 of 2)]
[im 1/43]
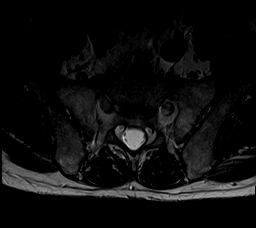
[im 7/43]
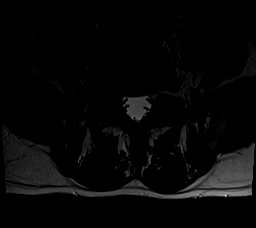
[im 13/43]
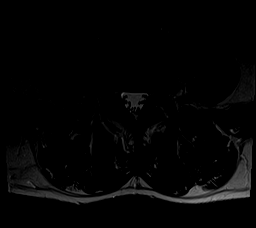
[im 19/43]
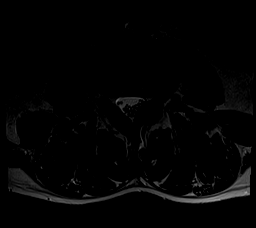
[im 22/43]
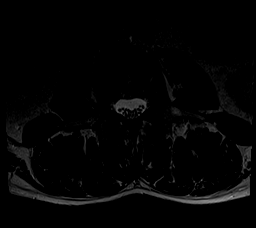
[im 25/43]
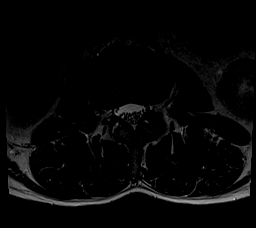
[im 31/43]
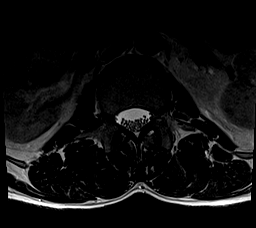
[im 37/43]
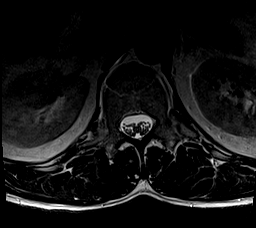
[im 43/43]
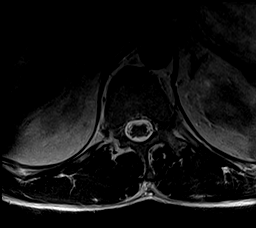

[Series 7: T1 · axial · 4.0mm · 0.35mm/px · z∈[-94,+103]mm · 5 of 43 slices shown (2 of 2)]
[im 1/43]
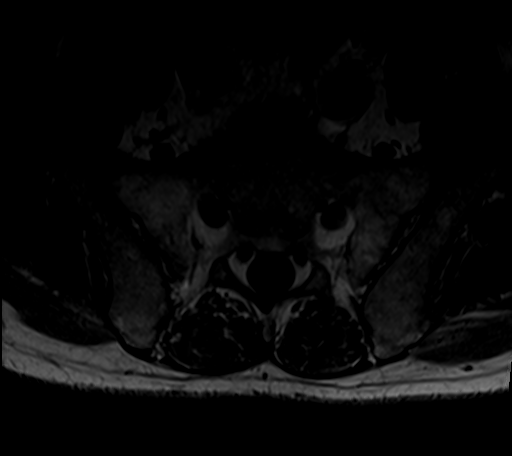
[im 7/43]
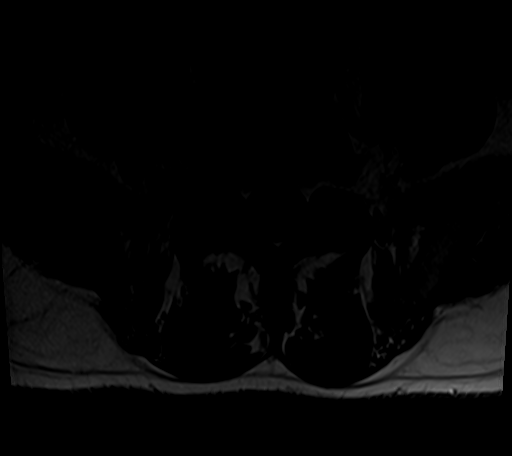
[im 13/43]
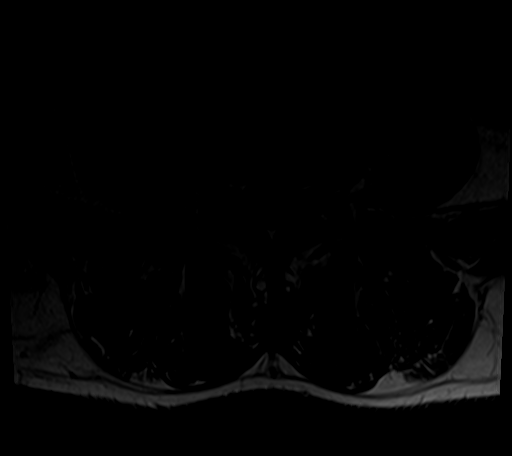
[im 22/43]
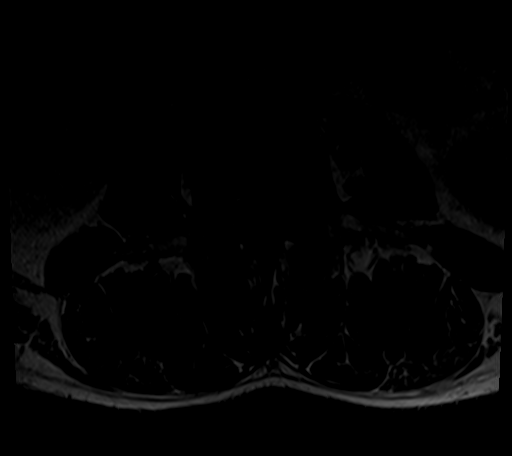
[im 37/43]
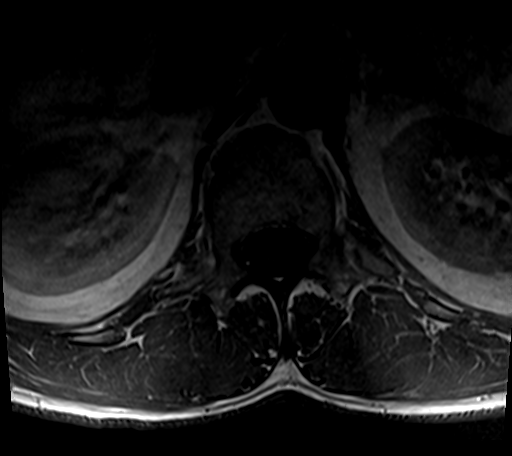

[26 of 48 positions shown; findings below may reference images not displayed]

FINDINGS: Segmentation:  Standard.

Alignment:  Physiologic.

Vertebrae:  No fracture, evidence of discitis, or bone lesion.

Conus medullaris and cauda equina: Conus extends to the L1-2 level.
Conus and cauda equina appear normal.

Paraspinal and other soft tissues: Negative

Disc levels:

T12- L1: Unremarkable.

L1-L2: Unremarkable.

L2-L3: Unremarkable.

L3-L4: Asymmetric leftward disc narrowing and bulging with endplate
ridging. Noncompressive left foraminal and subarticular recess
narrowing that is stable from prior. The disc has lost further
height since prior, with greater endplate degeneration and marrow
alteration posteriorly.

L4-L5: Disc narrowing and bulging.  Patent canal and foramina

L5-S1:Disc narrowing and right far-lateral predominant endplate
spurring and degeneration. Noncompressive bilateral foraminal
narrowing.
IMPRESSION: Disc degeneration at L3-4 and below with progressive disc height
loss since 2611. No progressive or compressive stenosis. No specific
explanation for right leg symptoms.

## 2019-02-04 ENCOUNTER — Other Ambulatory Visit: Payer: Self-pay | Admitting: Psychiatry

## 2019-02-05 NOTE — Telephone Encounter (Signed)
Gregory Hardy, I was trying to confirm his dose, the chart says he should only be on 2/day but the request is for 3/day. I tried to reach him but was unable to.

## 2019-02-05 NOTE — Telephone Encounter (Signed)
[facility] - [address] called to check the status of the refill request for lithium ER 450mg .  Next appt for Gregory Hardy is 03/04/19

## 2019-02-05 NOTE — Telephone Encounter (Signed)
Call to clarify dose

## 2019-03-04 ENCOUNTER — Ambulatory Visit: Payer: BLUE CROSS/BLUE SHIELD | Admitting: Psychiatry

## 2019-04-22 ENCOUNTER — Other Ambulatory Visit: Payer: Self-pay | Admitting: Psychiatry

## 2019-05-11 DIAGNOSIS — M5416 Radiculopathy, lumbar region: Secondary | ICD-10-CM | POA: Diagnosis not present

## 2019-05-11 DIAGNOSIS — M792 Neuralgia and neuritis, unspecified: Secondary | ICD-10-CM | POA: Diagnosis not present

## 2019-05-11 DIAGNOSIS — M549 Dorsalgia, unspecified: Secondary | ICD-10-CM | POA: Diagnosis not present

## 2019-05-20 DIAGNOSIS — M9903 Segmental and somatic dysfunction of lumbar region: Secondary | ICD-10-CM | POA: Diagnosis not present

## 2019-05-20 DIAGNOSIS — M5411 Radiculopathy, occipito-atlanto-axial region: Secondary | ICD-10-CM | POA: Diagnosis not present

## 2019-05-20 DIAGNOSIS — M9901 Segmental and somatic dysfunction of cervical region: Secondary | ICD-10-CM | POA: Diagnosis not present

## 2019-05-24 ENCOUNTER — Other Ambulatory Visit: Payer: Self-pay | Admitting: Psychiatry

## 2019-05-25 NOTE — Telephone Encounter (Signed)
Still no appt scheduled, last visit 08/2018

## 2019-05-25 NOTE — Telephone Encounter (Signed)
Call to reschedule him 30-minute

## 2019-06-01 DIAGNOSIS — M9901 Segmental and somatic dysfunction of cervical region: Secondary | ICD-10-CM | POA: Diagnosis not present

## 2019-06-01 DIAGNOSIS — M5411 Radiculopathy, occipito-atlanto-axial region: Secondary | ICD-10-CM | POA: Diagnosis not present

## 2019-06-01 DIAGNOSIS — M9903 Segmental and somatic dysfunction of lumbar region: Secondary | ICD-10-CM | POA: Diagnosis not present

## 2019-06-02 DIAGNOSIS — M9901 Segmental and somatic dysfunction of cervical region: Secondary | ICD-10-CM | POA: Diagnosis not present

## 2019-06-02 DIAGNOSIS — M9903 Segmental and somatic dysfunction of lumbar region: Secondary | ICD-10-CM | POA: Diagnosis not present

## 2019-06-02 DIAGNOSIS — M5411 Radiculopathy, occipito-atlanto-axial region: Secondary | ICD-10-CM | POA: Diagnosis not present

## 2019-06-02 NOTE — Telephone Encounter (Signed)
Josh called to make appt. 06/29/19.  He is out of network and knows he has to pay the full rate.

## 2019-06-03 ENCOUNTER — Other Ambulatory Visit: Payer: Self-pay

## 2019-06-10 DIAGNOSIS — M9903 Segmental and somatic dysfunction of lumbar region: Secondary | ICD-10-CM | POA: Diagnosis not present

## 2019-06-10 DIAGNOSIS — M5411 Radiculopathy, occipito-atlanto-axial region: Secondary | ICD-10-CM | POA: Diagnosis not present

## 2019-06-10 DIAGNOSIS — M9901 Segmental and somatic dysfunction of cervical region: Secondary | ICD-10-CM | POA: Diagnosis not present

## 2019-06-11 ENCOUNTER — Other Ambulatory Visit: Payer: Self-pay | Admitting: Psychiatry

## 2019-06-11 NOTE — Telephone Encounter (Signed)
Submitted 09/10

## 2019-06-29 ENCOUNTER — Other Ambulatory Visit: Payer: Self-pay

## 2019-06-29 ENCOUNTER — Ambulatory Visit (INDEPENDENT_AMBULATORY_CARE_PROVIDER_SITE_OTHER): Payer: BLUE CROSS/BLUE SHIELD | Admitting: Psychiatry

## 2019-06-29 ENCOUNTER — Encounter: Payer: Self-pay | Admitting: Psychiatry

## 2019-06-29 DIAGNOSIS — F3176 Bipolar disorder, in full remission, most recent episode depressed: Secondary | ICD-10-CM | POA: Diagnosis not present

## 2019-06-29 DIAGNOSIS — F1021 Alcohol dependence, in remission: Secondary | ICD-10-CM

## 2019-06-29 DIAGNOSIS — Z79899 Other long term (current) drug therapy: Secondary | ICD-10-CM

## 2019-06-29 MED ORDER — LITHIUM CARBONATE ER 450 MG PO TBCR: 900.0000 mg | EXTENDED_RELEASE_TABLET | Freq: Every day | ORAL | 3 refills | Status: DC

## 2019-06-29 NOTE — Progress Notes (Signed)
Gregory EllisJoshua A Radi [id number] 1971-08-06 48 y.o.  Subjective:   Patient ID:  Gregory Hardy is a 48 y.o. (DOB 1971-08-06) male.  Chief Complaint:  Chief Complaint  Patient presents with  . Follow-up    bipolar and sobriety    HPI Gregory Hardy presents to the office today for follow-up of bipolar disorder.   He was last seen December 2019.  No meds were changed.  He has missed some appointments since then.  It was suggested he get a lithium level at his last appointment.  That was never received.  I've been good.  Things been pretty stable.  Still in recovery, 4 years next month.  Hard to do zoom meetings for AA.  Has learned the steps and works the steps.  He believes his conscious is his higher power and does appeal to right and wrong.  Zoom not great for ADD person like himself.  Business still good and great.  Still at the gym regularly.  Less anger.  Done ok with less lithium for months..  Uses CBT coping skills.  No mood swings of significance.   Before last relapse  He had 10 years sobriety, differently this time better exercise and diet.  [place] in [place].  Doesn't feel the effects from the lithium but when out for 2 days and he felt more angry and frustrated.   Review of Systems:  Review of Systems  Musculoskeletal: Positive for back pain.  Neurological: Negative for tremors and weakness.  Psychiatric/Behavioral: Negative for agitation, behavioral problems, confusion, decreased concentration, dysphoric mood, hallucinations, self-injury, sleep disturbance and suicidal ideas. The patient is not nervous/anxious and is not hyperactive.     Medications: I have reviewed the patient's current medications.  Current Outpatient Medications  Medication Sig Dispense Refill  . ibuprofen (ADVIL,MOTRIN) 200 MG tablet Take 800 mg by mouth every 6 (six) hours as needed for moderate pain.    Marland Kitchen. lithium carbonate (ESKALITH) 450 MG CR tablet TAKE 3 TABLETS BY MOUTH EVERY DAY  (Patient taking differently: Take 900 mg by mouth daily. ) 90 tablet 0  . tamsulosin (FLOMAX) 0.4 MG CAPS capsule Take 0.4 mg by mouth daily.  11   No current facility-administered medications for this visit.     Medication Side Effects: None  Allergies: No Known Allergies  Past Medical History:  Diagnosis Date  . Anxiety   . Bipolar 1 disorder (HCC)   . Opiate addiction (HCC)     History reviewed. No pertinent family history.  Social History   Socioeconomic History  . Marital status: Married    Spouse name: Not on file  . Number of children: Not on file  . Years of education: Not on file  . Highest education level: Not on file  Occupational History  . Not on file  Social Needs  . Financial resource strain: Not on file  . Food insecurity    Worry: Not on file    Inability: Not on file  . Transportation needs    Medical: Not on file    Non-medical: Not on file  Tobacco Use  . Smoking status: Current Every Day Smoker    Packs/day: 0.50    Types: E-cigarettes  . Smokeless tobacco: Never Used  Substance and Sexual Activity  . Alcohol use: Yes    Comment: At least 6 drinks daily. Last drink: 22:30 last night.   . Drug use: Yes    Types: Marijuana    Comment: History of heroin. Last used: "  a few days". "Maybe Thursday or Friday of last week"  . Sexual activity: Not on file  Lifestyle  . Physical activity    Days per week: Not on file    Minutes per session: Not on file  . Stress: Not on file  Relationships  . Social Musician on phone: Not on file    Gets together: Not on file    Attends religious service: Not on file    Active member of club or organization: Not on file    Attends meetings of clubs or organizations: Not on file    Relationship status: Not on file  . Intimate partner violence    Fear of current or ex partner: Not on file    Emotionally abused: Not on file    Physically abused: Not on file    Forced sexual activity: Not on file   Other Topics Concern  . Not on file  Social History Narrative  . Not on file    Past Medical History, Surgical history, Social history, and Family history were reviewed and updated as appropriate.   Please see review of systems for further details on the patient's review from today.   Objective:   Physical Exam:  There were no vitals taken for this visit.  Physical Exam Constitutional:      General: He is not in acute distress.    Appearance: He is well-developed.  Musculoskeletal:        General: No deformity.  Neurological:     Mental Status: He is alert and oriented to person, place, and time.     Motor: No tremor.     Coordination: Coordination normal.     Gait: Gait normal.  Psychiatric:        Attention and Perception: He is attentive. He does not perceive auditory hallucinations.        Mood and Affect: Mood is not anxious or depressed. Affect is not labile, blunt, angry or inappropriate.        Speech: Speech normal.        Behavior: Behavior normal.        Thought Content: Thought content normal. Thought content does not include homicidal or suicidal ideation. Thought content does not include homicidal or suicidal plan.        Cognition and Memory: Cognition normal.        Judgment: Judgment normal.     Comments: Insight intact. No auditory or visual hallucinations. No delusions.      Lab Review:     Component Value Date/Time   NA 135 08/07/2015 0339   K 3.3 (L) 08/07/2015 0339   CL 100 (L) 08/07/2015 0339   CO2 23 08/07/2015 0339   GLUCOSE 93 08/07/2015 0339   BUN 10 08/07/2015 0339   CREATININE 0.92 08/07/2015 0339   CALCIUM 9.4 08/07/2015 0339   PROT 8.0 08/07/2015 0339   ALBUMIN 4.6 08/07/2015 0339   AST 21 08/07/2015 0339   ALT 21 08/07/2015 0339   ALKPHOS 57 08/07/2015 0339   BILITOT 0.9 08/07/2015 0339   GFRNONAA >60 08/07/2015 0339   GFRAA >60 08/07/2015 0339       Component Value Date/Time   WBC 12.3 (H) 08/07/2015 0339   RBC 4.66  08/07/2015 0339   HGB 15.3 08/07/2015 0339   HCT 44.5 08/07/2015 0339   PLT 262 08/07/2015 0339   MCV 95.5 08/07/2015 0339   MCH 32.8 08/07/2015 0339   MCHC 34.4 08/07/2015 0339  RDW 12.6 08/07/2015 0339   LYMPHSABS 2.8 08/07/2015 0339   MONOABS 1.1 (H) 08/07/2015 0339   EOSABS 0.2 08/07/2015 0339   BASOSABS 0.1 08/07/2015 0339    Lithium Lvl  Date Value Ref Range Status  08/07/2015 0.19 (L) 0.60 - 1.20 mmol/L Final     No results found for: PHENYTOIN, PHENOBARB, VALPROATE, CBMZ   Last lithium 03/09/18 = 0.4 at 900 mg daily. Normal BMP and TSH.  .res Assessment: Plan:    Bipolar 1 disorder, depressed, full remission ([place])  Alcohol dependence in remission ([place])   Meds stable and mood and mood without problems.  Stable on low dose lithium.  Disc risks with low dosage. Counseled patient regarding potential benefits, risks, and side effects of lithium to include potential risk of lithium affecting thyroid and renal function.  Discussed need for periodic lab monitoring to determine drug level and to assess for potential adverse effects.  Counseled patient regarding signs and symptoms of lithium toxicity and advised that they notify office immediately or seek urgent medical attention if experiencing these signs and symptoms.  Patient advised to contact office with any questions or concerns.  Check lithium level, BMP and TSH.  This was emphasized this visit because he has not done it.  However he has been very stable for a number of years.  Recent increased irritability when he missed a few doses and he is committed to staying on the medication.  FU 12 months because stable on low-dose lithium  Gregory Parents, MD, DFAPA   Please see After Visit Summary for patient specific instructions.  No future appointments.  No orders of the defined types were placed in this encounter.     -------------------------------

## 2019-08-05 ENCOUNTER — Other Ambulatory Visit: Payer: Self-pay | Admitting: Psychiatry

## 2019-08-09 DIAGNOSIS — M9901 Segmental and somatic dysfunction of cervical region: Secondary | ICD-10-CM | POA: Diagnosis not present

## 2019-08-09 DIAGNOSIS — M5411 Radiculopathy, occipito-atlanto-axial region: Secondary | ICD-10-CM | POA: Diagnosis not present

## 2019-08-09 DIAGNOSIS — M9903 Segmental and somatic dysfunction of lumbar region: Secondary | ICD-10-CM | POA: Diagnosis not present

## 2019-08-12 DIAGNOSIS — M9901 Segmental and somatic dysfunction of cervical region: Secondary | ICD-10-CM | POA: Diagnosis not present

## 2019-08-12 DIAGNOSIS — M9903 Segmental and somatic dysfunction of lumbar region: Secondary | ICD-10-CM | POA: Diagnosis not present

## 2019-08-12 DIAGNOSIS — M5411 Radiculopathy, occipito-atlanto-axial region: Secondary | ICD-10-CM | POA: Diagnosis not present

## 2019-08-23 DIAGNOSIS — M9901 Segmental and somatic dysfunction of cervical region: Secondary | ICD-10-CM | POA: Diagnosis not present

## 2019-08-23 DIAGNOSIS — M9903 Segmental and somatic dysfunction of lumbar region: Secondary | ICD-10-CM | POA: Diagnosis not present

## 2019-08-23 DIAGNOSIS — M5411 Radiculopathy, occipito-atlanto-axial region: Secondary | ICD-10-CM | POA: Diagnosis not present

## 2019-08-26 DIAGNOSIS — M9901 Segmental and somatic dysfunction of cervical region: Secondary | ICD-10-CM | POA: Diagnosis not present

## 2019-08-26 DIAGNOSIS — M9903 Segmental and somatic dysfunction of lumbar region: Secondary | ICD-10-CM | POA: Diagnosis not present

## 2019-08-26 DIAGNOSIS — M5411 Radiculopathy, occipito-atlanto-axial region: Secondary | ICD-10-CM | POA: Diagnosis not present

## 2019-09-27 DIAGNOSIS — M9903 Segmental and somatic dysfunction of lumbar region: Secondary | ICD-10-CM | POA: Diagnosis not present

## 2019-09-27 DIAGNOSIS — M5411 Radiculopathy, occipito-atlanto-axial region: Secondary | ICD-10-CM | POA: Diagnosis not present

## 2019-09-27 DIAGNOSIS — M9901 Segmental and somatic dysfunction of cervical region: Secondary | ICD-10-CM | POA: Diagnosis not present

## 2019-10-07 DIAGNOSIS — M9901 Segmental and somatic dysfunction of cervical region: Secondary | ICD-10-CM | POA: Diagnosis not present

## 2019-10-07 DIAGNOSIS — M9903 Segmental and somatic dysfunction of lumbar region: Secondary | ICD-10-CM | POA: Diagnosis not present

## 2019-10-07 DIAGNOSIS — M5411 Radiculopathy, occipito-atlanto-axial region: Secondary | ICD-10-CM | POA: Diagnosis not present

## 2019-12-09 DIAGNOSIS — M9901 Segmental and somatic dysfunction of cervical region: Secondary | ICD-10-CM | POA: Diagnosis not present

## 2019-12-09 DIAGNOSIS — M9903 Segmental and somatic dysfunction of lumbar region: Secondary | ICD-10-CM | POA: Diagnosis not present

## 2019-12-09 DIAGNOSIS — M5411 Radiculopathy, occipito-atlanto-axial region: Secondary | ICD-10-CM | POA: Diagnosis not present

## 2019-12-15 ENCOUNTER — Telehealth: Payer: Self-pay | Admitting: Psychiatry

## 2019-12-15 DIAGNOSIS — R194 Change in bowel habit: Secondary | ICD-10-CM | POA: Diagnosis not present

## 2019-12-15 DIAGNOSIS — N401 Enlarged prostate with lower urinary tract symptoms: Secondary | ICD-10-CM | POA: Diagnosis not present

## 2019-12-15 DIAGNOSIS — Z0000 Encounter for general adult medical examination without abnormal findings: Secondary | ICD-10-CM | POA: Diagnosis not present

## 2019-12-15 DIAGNOSIS — F1011 Alcohol abuse, in remission: Secondary | ICD-10-CM | POA: Diagnosis not present

## 2019-12-15 NOTE — Telephone Encounter (Signed)
Patient called and said that her needs labs to resubmitited to [facility] due to expiration. Once these are in the chart and been sent he would like [place] to mail him the originals so he can take them to be done

## 2019-12-16 ENCOUNTER — Other Ambulatory Visit: Payer: Self-pay | Admitting: Psychiatry

## 2019-12-16 ENCOUNTER — Ambulatory Visit: Payer: Self-pay | Attending: Internal Medicine

## 2019-12-16 DIAGNOSIS — F3176 Bipolar disorder, in full remission, most recent episode depressed: Secondary | ICD-10-CM

## 2019-12-16 DIAGNOSIS — Z79899 Other long term (current) drug therapy: Secondary | ICD-10-CM

## 2019-12-16 DIAGNOSIS — Z23 Encounter for immunization: Secondary | ICD-10-CM

## 2019-12-16 NOTE — Progress Notes (Signed)
   Covid-19 Vaccination Clinic  Name:  Gregory Hardy    MRN: [id number] DOB: 1971-04-21  12/16/2019  [name] was observed post Covid-19 immunization for 15 minutes without incident. He was provided with Vaccine Information Sheet and instruction to access the V-Safe system.   [name] was instructed to call 911 with any severe reactions post vaccine: Marland Kitchen Difficulty breathing  . Swelling of face and throat  . A fast heartbeat  . A bad rash all over body  . Dizziness and weakness   Immunizations Administered    Name Date Dose VIS Date Route   Pfizer COVID-19 Vaccine 12/16/2019 12:12 PM 0.3 mL 09/03/2019 Intramuscular   Manufacturer: [employer], [employer]   Lot: [id number]   NDC: [address]-0

## 2019-12-16 NOTE — Telephone Encounter (Signed)
Lab order sent to [facility].  Let pt know

## 2019-12-17 NOTE — Telephone Encounter (Signed)
Lab orders will be printed off and mailed to patient per request

## 2020-01-10 ENCOUNTER — Ambulatory Visit: Payer: Self-pay | Attending: Internal Medicine

## 2020-01-10 DIAGNOSIS — Z23 Encounter for immunization: Secondary | ICD-10-CM

## 2020-01-10 NOTE — Progress Notes (Signed)
   Covid-19 Vaccination Clinic  Name:  JAMIE BELGER    MRN: [id number] DOB: Mar 25, 1971  01/10/2020  Mr. Olshefski was observed post Covid-19 immunization for 15 minutes without incident. He was provided with Vaccine Information Sheet and instruction to access the V-Safe system.   Mr. [place] was instructed to call 911 with any severe reactions post vaccine: Marland Kitchen Difficulty breathing  . Swelling of face and throat  . A fast heartbeat  . A bad rash all over body  . Dizziness and weakness   Immunizations Administered    Name Date Dose VIS Date Route   Pfizer COVID-19 Vaccine 01/10/2020 11:22 AM 0.3 mL 11/17/2018 Intramuscular   Manufacturer: [employer], [employer]   Lot: [id number]   NDC: [address]-5

## 2020-01-18 DIAGNOSIS — Z125 Encounter for screening for malignant neoplasm of prostate: Secondary | ICD-10-CM | POA: Diagnosis not present

## 2020-01-18 DIAGNOSIS — Z1159 Encounter for screening for other viral diseases: Secondary | ICD-10-CM | POA: Diagnosis not present

## 2020-01-18 DIAGNOSIS — Z13220 Encounter for screening for lipoid disorders: Secondary | ICD-10-CM | POA: Diagnosis not present

## 2020-02-24 DIAGNOSIS — R3989 Other symptoms and signs involving the genitourinary system: Secondary | ICD-10-CM | POA: Diagnosis not present

## 2020-02-24 DIAGNOSIS — R3912 Poor urinary stream: Secondary | ICD-10-CM | POA: Diagnosis not present

## 2020-02-24 DIAGNOSIS — R399 Unspecified symptoms and signs involving the genitourinary system: Secondary | ICD-10-CM | POA: Diagnosis not present

## 2020-02-24 DIAGNOSIS — N401 Enlarged prostate with lower urinary tract symptoms: Secondary | ICD-10-CM | POA: Diagnosis not present

## 2020-03-06 DIAGNOSIS — M9901 Segmental and somatic dysfunction of cervical region: Secondary | ICD-10-CM | POA: Diagnosis not present

## 2020-03-06 DIAGNOSIS — M9903 Segmental and somatic dysfunction of lumbar region: Secondary | ICD-10-CM | POA: Diagnosis not present

## 2020-03-08 DIAGNOSIS — M9903 Segmental and somatic dysfunction of lumbar region: Secondary | ICD-10-CM | POA: Diagnosis not present

## 2020-03-08 DIAGNOSIS — M9901 Segmental and somatic dysfunction of cervical region: Secondary | ICD-10-CM | POA: Diagnosis not present

## 2020-03-15 DIAGNOSIS — Z23 Encounter for immunization: Secondary | ICD-10-CM | POA: Diagnosis not present

## 2020-04-05 DIAGNOSIS — H61002 Unspecified perichondritis of left external ear: Secondary | ICD-10-CM | POA: Diagnosis not present

## 2020-05-16 DIAGNOSIS — D120 Benign neoplasm of cecum: Secondary | ICD-10-CM | POA: Diagnosis not present

## 2020-05-16 DIAGNOSIS — Z1211 Encounter for screening for malignant neoplasm of colon: Secondary | ICD-10-CM | POA: Diagnosis not present

## 2020-05-16 DIAGNOSIS — K641 Second degree hemorrhoids: Secondary | ICD-10-CM | POA: Diagnosis not present

## 2020-05-16 DIAGNOSIS — D123 Benign neoplasm of transverse colon: Secondary | ICD-10-CM | POA: Diagnosis not present

## 2020-05-31 DIAGNOSIS — R399 Unspecified symptoms and signs involving the genitourinary system: Secondary | ICD-10-CM | POA: Diagnosis not present

## 2020-06-27 ENCOUNTER — Other Ambulatory Visit: Payer: Self-pay

## 2020-06-27 ENCOUNTER — Telehealth: Payer: Self-pay | Admitting: Psychiatry

## 2020-06-27 ENCOUNTER — Ambulatory Visit (INDEPENDENT_AMBULATORY_CARE_PROVIDER_SITE_OTHER): Payer: BLUE CROSS/BLUE SHIELD | Admitting: Psychiatry

## 2020-06-27 ENCOUNTER — Encounter: Payer: Self-pay | Admitting: Psychiatry

## 2020-06-27 DIAGNOSIS — F17213 Nicotine dependence, cigarettes, with withdrawal: Secondary | ICD-10-CM | POA: Diagnosis not present

## 2020-06-27 DIAGNOSIS — F3176 Bipolar disorder, in full remission, most recent episode depressed: Secondary | ICD-10-CM | POA: Diagnosis not present

## 2020-06-27 DIAGNOSIS — F1021 Alcohol dependence, in remission: Secondary | ICD-10-CM | POA: Diagnosis not present

## 2020-06-27 DIAGNOSIS — Z79899 Other long term (current) drug therapy: Secondary | ICD-10-CM | POA: Diagnosis not present

## 2020-06-27 MED ORDER — VARENICLINE TARTRATE 1 MG PO TABS: 1.0000 mg | ORAL_TABLET | Freq: Two times a day (BID) | ORAL | 3 refills | Status: DC

## 2020-06-27 MED ORDER — LITHIUM CARBONATE ER 450 MG PO TBCR: 900.0000 mg | EXTENDED_RELEASE_TABLET | Freq: Every day | ORAL | 3 refills | Status: DC

## 2020-06-27 NOTE — Progress Notes (Signed)
Gregory Hardy [id number] 1971/05/06 49 y.o.  Subjective:   Patient ID:  Gregory Hardy is a 49 y.o. (DOB 03/29/1971) male.  Chief Complaint:  Chief Complaint  Patient presents with  . Follow-up    HPI Gregory Hardy presents to the office today for follow-up of bipolar disorder.   He was last seen 06/2019.  No meds were changed.    06/27/2020 appt with the following noted. I've been good.  Things been pretty stable.  Still in recovery, 5 years next month.  Goes to a couple of meetings a week.  He feels his brain has recovered from the years of alcohol abuse. Focuses a lot on exercise and nutrition.  Gym daily for a couple of years. 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  Less anger.  Done ok with less lithium for months..  Uses CBT coping skills.  No mood swings of significance.   No sugar and only water. Good PE with labs.  Before last relapse  He had 10 years sobriety, differently this time better exercise and diet.  Lives in [place].  Doesn't feel the effects from the lithium but when out for 2 days and he felt more angry and frustrated.   Questions about smoking cessation.   Review of Systems:  Review of Systems  Musculoskeletal: Negative for back pain.  Neurological: Negative for tremors and weakness.  Psychiatric/Behavioral: Negative for agitation, behavioral problems, confusion, decreased concentration, dysphoric mood, hallucinations, self-injury, sleep disturbance and suicidal ideas. The patient is not nervous/anxious and is not hyperactive.     Medications: I have reviewed the patient's current medications.  Current Outpatient Medications  Medication Sig Dispense Refill  . ibuprofen (ADVIL,MOTRIN) 200 MG tablet Take 800 mg by mouth every 6 (six) hours as needed for moderate pain.    Marland Kitchen lithium carbonate (ESKALITH) 450 MG CR tablet Take 2 tablets (900 mg total) by mouth daily. 180 tablet 3  . tamsulosin (FLOMAX) 0.4 MG CAPS capsule Take 0.4 mg  by mouth daily.  11  . varenicline (CHANTIX CONTINUING MONTH PAK) 1 MG tablet Take 1 tablet (1 mg total) by mouth 2 (two) times daily. 60 tablet 3   No current facility-administered medications for this visit.    Medication Side Effects: None  Allergies: No Known Allergies  Past Medical History:  Diagnosis Date  . Anxiety   . Bipolar 1 disorder (HCC)   . Opiate addiction (HCC)     History reviewed. No pertinent family history.  Social History   Socioeconomic History  . Marital status: Married    Spouse name: Not on file  . Number of children: Not on file  . Years of education: Not on file  . Highest education level: Not on file  Occupational History  . Not on file  Tobacco Use  . Smoking status: Current Every Day Smoker    Packs/day: 0.50    Types: E-cigarettes  . Smokeless tobacco: Never Used  Substance and Sexual Activity  . Alcohol use: Yes    Comment: At least 6 drinks daily. Last drink: 22:30 last night.   . Drug use: Yes    Types: Marijuana    Comment: History of heroin. Last used: "a few days". "Maybe Thursday or Friday of last week"  . Sexual activity: Not on file  Other Topics Concern  . Not on file  Social History Narrative  . Not on file   Social Determinants of Health   Financial Resource Strain:   . Difficulty  of Paying Living Expenses: Not on file  Food Insecurity:   . Worried About Programme researcher, broadcasting/film/video in the Last Year: Not on file  . Ran Out of Food in the Last Year: Not on file  Transportation Needs:   . Lack of Transportation (Medical): Not on file  . Lack of Transportation (Non-Medical): Not on file  Physical Activity:   . Days of Exercise per Week: Not on file  . Minutes of Exercise per Session: Not on file  Stress:   . Feeling of Stress : Not on file  Social Connections:   . Frequency of Communication with Friends and Family: Not on file  . Frequency of Social Gatherings with Friends and Family: Not on file  . Attends Religious  Services: Not on file  . Active Member of Clubs or Organizations: Not on file  . Attends Banker Meetings: Not on file  . Marital Status: Not on file  Intimate Partner Violence:   . Fear of Current or Ex-Partner: Not on file  . Emotionally Abused: Not on file  . Physically Abused: Not on file  . Sexually Abused: Not on file    Past Medical History, Surgical history, Social history, and Family history were reviewed and updated as appropriate.   Please see review of systems for further details on the patient's review from today.   Objective:   Physical Exam:  There were no vitals taken for this visit.  Physical Exam Constitutional:      General: He is not in acute distress.    Appearance: He is well-developed.  Musculoskeletal:        General: No deformity.  Neurological:     Mental Status: He is alert and oriented to person, place, and time.     Motor: No tremor.     Coordination: Coordination normal.     Gait: Gait normal.  Psychiatric:        Attention and Perception: He is attentive. He does not perceive auditory hallucinations.        Mood and Affect: Mood is not anxious or depressed. Affect is not labile, blunt, angry or inappropriate.        Speech: Speech normal. Speech is not rapid and pressured.        Behavior: Behavior normal.        Thought Content: Thought content normal. Thought content does not include homicidal or suicidal ideation. Thought content does not include homicidal or suicidal plan.        Cognition and Memory: Cognition normal.        Judgment: Judgment normal.     Comments: Insight intact. No auditory or visual hallucinations. No delusions.      Lab Review:     Component Value Date/Time   NA 135 08/07/2015 0339   K 3.3 (L) 08/07/2015 0339   CL 100 (L) 08/07/2015 0339   CO2 23 08/07/2015 0339   GLUCOSE 93 08/07/2015 0339   BUN 10 08/07/2015 0339   CREATININE 0.92 08/07/2015 0339   CALCIUM 9.4 08/07/2015 0339   PROT 8.0  08/07/2015 0339   ALBUMIN 4.6 08/07/2015 0339   AST 21 08/07/2015 0339   ALT 21 08/07/2015 0339   ALKPHOS 57 08/07/2015 0339   BILITOT 0.9 08/07/2015 0339   GFRNONAA >60 08/07/2015 0339   GFRAA >60 08/07/2015 0339       Component Value Date/Time   WBC 12.3 (H) 08/07/2015 0339   RBC 4.66 08/07/2015 0339   HGB 15.3  08/07/2015 0339   HCT 44.5 08/07/2015 0339   PLT 262 08/07/2015 0339   MCV 95.5 08/07/2015 0339   MCH 32.8 08/07/2015 0339   MCHC 34.4 08/07/2015 0339   RDW 12.6 08/07/2015 0339   LYMPHSABS 2.8 08/07/2015 0339   MONOABS 1.1 (H) 08/07/2015 0339   EOSABS 0.2 08/07/2015 0339   BASOSABS 0.1 08/07/2015 0339    Lithium Lvl  Date Value Ref Range Status  08/07/2015 0.19 (L) 0.60 - 1.20 mmol/L Final     No results found for: PHENYTOIN, PHENOBARB, VALPROATE, CBMZ   Last lithium 03/09/18 = 0.4 at 900 mg daily. Normal BMP and TSH.  .res Assessment: Plan:    Bipolar 1 disorder, depressed, full remission (HCC) - Plan: Lithium level, TSH, varenicline (CHANTIX CONTINUING MONTH PAK) 1 MG tablet, lithium carbonate (ESKALITH) 450 MG CR tablet  Lithium use - Plan: Lithium level, TSH, varenicline (CHANTIX CONTINUING MONTH PAK) 1 MG tablet, lithium carbonate (ESKALITH) 450 MG CR tablet  Alcohol dependence in remission (HCC)  Cigarette nicotine dependence with withdrawal   Meds stable and mood and mood without problems.  Stable on low dose lithium.  Disc risks with low dosage. Counseled patient regarding potential benefits, risks, and side effects of lithium to include potential risk of lithium affecting thyroid and renal function.  Discussed need for periodic lab monitoring to determine drug level and to assess for potential adverse effects.  Counseled patient regarding signs and symptoms of lithium toxicity and advised that they notify office immediately or seek urgent medical attention if experiencing these signs and symptoms.  Patient advised to contact office with any questions  or concerns.  Check lithium level, BMP and TSH.  This was emphasized this visit because he has not done it.  However he has been very stable for a number of years.  Recent increased irritability when he missed a few doses and he is committed to staying on the medication.  Pursuit of sobriety.  FU 12 months because stable on low-dose lithium  Meredith Staggers, MD, DFAPA   Please see After Visit Summary for patient specific instructions.  No future appointments.  Orders Placed This Encounter  Procedures  . Lithium level  . TSH      -------------------------------

## 2020-06-27 NOTE — Telephone Encounter (Signed)
[facility] on [address] called to report that they couldn't process the prescription for Chantix for [place].  [facility] has totally taken it out of their system because it has been back ordered for so long.  She did not know if other pharmacies were able to get this product only that no [facility] can order it.  So they have taken this out.  You can send in another prescription for a different product or send a new prescription to another pharmacy.

## 2020-06-28 ENCOUNTER — Other Ambulatory Visit: Payer: Self-pay | Admitting: Psychiatry

## 2020-06-28 DIAGNOSIS — M545 Low back pain, unspecified: Secondary | ICD-10-CM | POA: Diagnosis not present

## 2020-06-28 LAB — TSH: TSH: 2.06 mIU/L (ref 0.40–4.50)

## 2020-06-28 LAB — LITHIUM LEVEL: Lithium Lvl: 0.4 mmol/L — ABNORMAL LOW (ref 0.6–1.2)

## 2020-06-28 NOTE — Telephone Encounter (Signed)
Please review

## 2020-06-28 NOTE — Telephone Encounter (Signed)
According to FDA website the following medication is being allowed to be imported from [place] due to the shortage of Chantix and we should be able to prescribe it.  It is in the epic system.  We could try sending [facility] to a [facility] or some other pharmacy for the patient. varenicline (APO-VARENICLINE) 1 MG tablet with the instructions 1/2 tablet twice a day for 1 week then 1 tablet twice a day #60 and 3 refills

## 2020-06-30 ENCOUNTER — Other Ambulatory Visit: Payer: Self-pay

## 2020-06-30 MED ORDER — VARENICLINE TARTRATE 1 MG PO TABS: 1.0000 mg | ORAL_TABLET | Freq: Two times a day (BID) | ORAL | 3 refills | Status: DC

## 2020-06-30 NOTE — Telephone Encounter (Signed)
Rx sent to [facility]

## 2020-07-06 DIAGNOSIS — M9901 Segmental and somatic dysfunction of cervical region: Secondary | ICD-10-CM | POA: Diagnosis not present

## 2020-07-06 DIAGNOSIS — M9903 Segmental and somatic dysfunction of lumbar region: Secondary | ICD-10-CM | POA: Diagnosis not present

## 2020-07-10 DIAGNOSIS — M9903 Segmental and somatic dysfunction of lumbar region: Secondary | ICD-10-CM | POA: Diagnosis not present

## 2020-07-10 DIAGNOSIS — M9901 Segmental and somatic dysfunction of cervical region: Secondary | ICD-10-CM | POA: Diagnosis not present

## 2020-07-12 DIAGNOSIS — L578 Other skin changes due to chronic exposure to nonionizing radiation: Secondary | ICD-10-CM | POA: Diagnosis not present

## 2020-07-12 DIAGNOSIS — L814 Other melanin hyperpigmentation: Secondary | ICD-10-CM | POA: Diagnosis not present

## 2020-07-13 DIAGNOSIS — M545 Low back pain, unspecified: Secondary | ICD-10-CM | POA: Diagnosis not present

## 2020-07-13 DIAGNOSIS — M9903 Segmental and somatic dysfunction of lumbar region: Secondary | ICD-10-CM | POA: Diagnosis not present

## 2020-07-13 DIAGNOSIS — M9901 Segmental and somatic dysfunction of cervical region: Secondary | ICD-10-CM | POA: Diagnosis not present

## 2020-07-17 DIAGNOSIS — M545 Low back pain, unspecified: Secondary | ICD-10-CM | POA: Diagnosis not present

## 2020-07-21 DIAGNOSIS — M545 Low back pain, unspecified: Secondary | ICD-10-CM | POA: Diagnosis not present

## 2020-07-24 DIAGNOSIS — M545 Low back pain, unspecified: Secondary | ICD-10-CM | POA: Diagnosis not present

## 2020-07-27 DIAGNOSIS — M545 Low back pain, unspecified: Secondary | ICD-10-CM | POA: Diagnosis not present

## 2020-08-08 DIAGNOSIS — M9901 Segmental and somatic dysfunction of cervical region: Secondary | ICD-10-CM | POA: Diagnosis not present

## 2020-08-08 DIAGNOSIS — M9903 Segmental and somatic dysfunction of lumbar region: Secondary | ICD-10-CM | POA: Diagnosis not present

## 2020-08-23 DIAGNOSIS — M9901 Segmental and somatic dysfunction of cervical region: Secondary | ICD-10-CM | POA: Diagnosis not present

## 2020-08-23 DIAGNOSIS — M9903 Segmental and somatic dysfunction of lumbar region: Secondary | ICD-10-CM | POA: Diagnosis not present

## 2020-09-15 ENCOUNTER — Other Ambulatory Visit: Payer: Self-pay | Admitting: Psychiatry

## 2020-09-15 DIAGNOSIS — Z79899 Other long term (current) drug therapy: Secondary | ICD-10-CM

## 2020-09-15 DIAGNOSIS — F3176 Bipolar disorder, in full remission, most recent episode depressed: Secondary | ICD-10-CM

## 2020-10-17 DIAGNOSIS — M9901 Segmental and somatic dysfunction of cervical region: Secondary | ICD-10-CM | POA: Diagnosis not present

## 2020-10-17 DIAGNOSIS — M9903 Segmental and somatic dysfunction of lumbar region: Secondary | ICD-10-CM | POA: Diagnosis not present

## 2020-10-30 DIAGNOSIS — Z0000 Encounter for general adult medical examination without abnormal findings: Secondary | ICD-10-CM | POA: Diagnosis not present

## 2020-11-01 DIAGNOSIS — R5383 Other fatigue: Secondary | ICD-10-CM | POA: Diagnosis not present

## 2020-11-01 DIAGNOSIS — Z13220 Encounter for screening for lipoid disorders: Secondary | ICD-10-CM | POA: Diagnosis not present

## 2021-06-27 ENCOUNTER — Ambulatory Visit (INDEPENDENT_AMBULATORY_CARE_PROVIDER_SITE_OTHER): Payer: BC Managed Care – PPO | Admitting: Psychiatry

## 2021-06-27 ENCOUNTER — Other Ambulatory Visit: Payer: Self-pay

## 2021-06-27 ENCOUNTER — Encounter: Payer: Self-pay | Admitting: Psychiatry

## 2021-06-27 DIAGNOSIS — R7989 Other specified abnormal findings of blood chemistry: Secondary | ICD-10-CM

## 2021-06-27 DIAGNOSIS — Z79899 Other long term (current) drug therapy: Secondary | ICD-10-CM

## 2021-06-27 DIAGNOSIS — F17213 Nicotine dependence, cigarettes, with withdrawal: Secondary | ICD-10-CM

## 2021-06-27 DIAGNOSIS — F3176 Bipolar disorder, in full remission, most recent episode depressed: Secondary | ICD-10-CM | POA: Diagnosis not present

## 2021-06-27 DIAGNOSIS — F1021 Alcohol dependence, in remission: Secondary | ICD-10-CM | POA: Diagnosis not present

## 2021-06-27 DIAGNOSIS — E291 Testicular hypofunction: Secondary | ICD-10-CM

## 2021-06-27 MED ORDER — LITHIUM CARBONATE ER 450 MG PO TBCR: 1350.0000 mg | EXTENDED_RELEASE_TABLET | Freq: Every day | ORAL | 3 refills | Status: DC

## 2021-06-27 NOTE — Progress Notes (Signed)
Gregory Hardy [id number] March 13, 1971 50 y.o.  Subjective:   Patient ID:  Gregory Hardy is a 50 y.o. (DOB 01-25-1971) male.  Chief Complaint:  Chief Complaint  Patient presents with   Follow-up   Depression    HPI [name] presents to the office today for follow-up of bipolar disorder.   He was last seen 06/2019.  No meds were changed.    06/27/2020 appt with the following noted. I've been good.  Things been pretty stable.  Still in recovery, 5 years next month.  Goes to a couple of meetings a week.  He feels his brain has recovered from the years of alcohol abuse. Focuses a lot on exercise and nutrition.  Gym daily for a couple of years. 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 Less anger.  Done ok with less lithium for months..  Uses CBT coping skills.  No mood swings of significance.  No sugar and only water. Good PE with labs. Plan: continue lithum 900  06/27/2021 appt needed: 5 kids. Doing well.  I feel fantastic.  Exercise mandatory and it has dramatically helped mood and health.  Sober still.  Making good money.  Worked hard.  Still playing golf.  No sig conflicts.   Patient reports stable mood and denies depressed or irritable moods.  Patient denies any recent difficulty with anxiety.  Patient denies difficulty with sleep initiation or maintenance. Denies appetite disturbance.  Patient reports that energy and motivation have been good.  Patient denies any difficulty with concentration.  Patient denies any suicidal ideation. No SE. Consistent with meds.  Before last relapse  He had 10 years sobriety, differently this time better exercise and diet.  Lives in [place].  Doesn't feel the effects from the lithium but when out for 2 days and he felt more angry and frustrated.   Questions about smoking cessation.   Review of Systems:  Review of Systems  Neurological:  Negative for tremors and weakness.  Psychiatric/Behavioral:  Negative for agitation,  behavioral problems, confusion, decreased concentration, dysphoric mood, hallucinations, self-injury, sleep disturbance and suicidal ideas. The patient is not nervous/anxious and is not hyperactive.    Medications: I have reviewed the patient's current medications.  Current Outpatient Medications  Medication Sig Dispense Refill   Multiple Vitamin (MULTIVITAMIN) tablet Take 1 tablet by mouth daily.     ibuprofen (ADVIL,MOTRIN) 200 MG tablet Take 800 mg by mouth every 6 (six) hours as needed for moderate pain. (Patient not taking: Reported on 06/27/2021)     lithium carbonate (ESKALITH) 450 MG CR tablet Take 3 tablets (1,350 mg total) by mouth daily. 270 tablet 3   tamsulosin (FLOMAX) 0.4 MG CAPS capsule Take 0.4 mg by mouth daily. (Patient not taking: Reported on 06/27/2021)  11   No current facility-administered medications for this visit.    Medication Side Effects: None  Allergies: No Known Allergies  Past Medical History:  Diagnosis Date   Anxiety    Bipolar 1 disorder (HCC)    Opiate addiction (HCC)     History reviewed. No pertinent family history.  Social History   Socioeconomic History   Marital status: Married    Spouse name: Not on file   Number of children: Not on file   Years of education: Not on file   Highest education level: Not on file  Occupational History   Not on file  Tobacco Use   Smoking status: Every Day    Packs/day: 0.50    Types: E-cigarettes,  Cigarettes   Smokeless tobacco: Never  Substance and Sexual Activity   Alcohol use: Yes    Comment: At least 6 drinks daily. Last drink: 22:30 last night.    Drug use: Yes    Types: Marijuana    Comment: History of heroin. Last used: "a few days". "Maybe Thursday or Friday of last week"   Sexual activity: Not on file  Other Topics Concern   Not on file  Social History Narrative   Not on file   Social Determinants of Health   Financial Resource Strain: Not on file  Food Insecurity: Not on file   Transportation Needs: Not on file  Physical Activity: Not on file  Stress: Not on file  Social Connections: Not on file  Intimate Partner Violence: Not on file    Past Medical History, Surgical history, Social history, and Family history were reviewed and updated as appropriate.   Please see review of systems for further details on the patient's review from today.   Objective:   Physical Exam:  There were no vitals taken for this visit.  Physical Exam Constitutional:      General: He is not in acute distress.    Appearance: He is well-developed.  Musculoskeletal:        General: No deformity.  Neurological:     Mental Status: He is alert and oriented to person, place, and time.     Motor: No tremor.     Coordination: Coordination normal.     Gait: Gait normal.  Psychiatric:        Attention and Perception: He is attentive. He does not perceive auditory hallucinations.        Mood and Affect: Mood is not anxious or depressed. Affect is not labile, blunt, angry or inappropriate.        Speech: Speech normal. Speech is not rapid and pressured.        Behavior: Behavior normal. Behavior is not agitated.        Thought Content: Thought content normal. Thought content does not include homicidal or suicidal ideation. Thought content does not include homicidal or suicidal plan.        Cognition and Memory: Cognition normal.        Judgment: Judgment normal.     Comments: Insight intact. No auditory or visual hallucinations. No delusions.  No mania.    Lab Review:     Component Value Date/Time   NA 135 08/07/2015 0339   K 3.3 (L) 08/07/2015 0339   CL 100 (L) 08/07/2015 0339   CO2 23 08/07/2015 0339   GLUCOSE 93 08/07/2015 0339   BUN 10 08/07/2015 0339   CREATININE 0.92 08/07/2015 0339   CALCIUM 9.4 08/07/2015 0339   PROT 8.0 08/07/2015 0339   ALBUMIN 4.6 08/07/2015 0339   AST 21 08/07/2015 0339   ALT 21 08/07/2015 0339   ALKPHOS 57 08/07/2015 0339   BILITOT 0.9  08/07/2015 0339   GFRNONAA >60 08/07/2015 0339   GFRAA >60 08/07/2015 0339       Component Value Date/Time   WBC 12.3 (H) 08/07/2015 0339   RBC 4.66 08/07/2015 0339   HGB 15.3 08/07/2015 0339   HCT 44.5 08/07/2015 0339   PLT 262 08/07/2015 0339   MCV 95.5 08/07/2015 0339   MCH 32.8 08/07/2015 0339   MCHC 34.4 08/07/2015 0339   RDW 12.6 08/07/2015 0339   LYMPHSABS 2.8 08/07/2015 0339   MONOABS 1.1 (H) 08/07/2015 0339   EOSABS 0.2 08/07/2015 2637  BASOSABS 0.1 08/07/2015 0339    Lithium Lvl  Date Value Ref Range Status  06/27/2020 0.4 (L) 0.6 - 1.2 mmol/L Final     No results found for: PHENYTOIN, PHENOBARB, VALPROATE, CBMZ   Last lithium 03/09/18 = 0.4 at 900 mg daily. Normal BMP and TSH.  .res Assessment: Plan:    Bipolar 1 disorder, depressed, full remission (HCC) - Plan: Lithium level, Lithium level, TSH, lithium carbonate (ESKALITH) 450 MG CR tablet  Lithium use - Plan: Lithium level, Lithium level, TSH, lithium carbonate (ESKALITH) 450 MG CR tablet  Alcohol dependence in remission (HCC)  Cigarette nicotine dependence with withdrawal  Low testosterone in male - Plan: Testosterone  Primary hypogonadism in male - Plan: Testosterone   Meds stable and mood and mood without problems.  Stable on low dose lithium.  Disc risks with low dosage. Counseled patient regarding potential benefits, risks, and side effects of lithium to include potential risk of lithium affecting thyroid and renal function.  Discussed need for periodic lab monitoring to determine drug level and to assess for potential adverse effects.  Counseled patient regarding signs and symptoms of lithium toxicity and advised that they notify office immediately or seek urgent medical attention if experiencing these signs and symptoms.  Patient advised to contact office with any questions or concerns.  Check lithium level, BMP and TSH.  This was emphasized this visit because he has not done it. Continue lithium  CR 900 HS  However he has been very stable for a number of years.  Recent increased irritability when he missed a few doses and he is committed to staying on the medication.  Pursuit of sobriety.  FU 12 months because stable on low-dose lithium  Meredith Staggers, MD, DFAPA   Please see After Visit Summary for patient specific instructions.  No future appointments.  Orders Placed This Encounter  Procedures   Lithium level   Lithium level   TSH   Testosterone       -------------------------------

## 2021-07-01 ENCOUNTER — Other Ambulatory Visit: Payer: Self-pay | Admitting: Psychiatry

## 2021-07-01 DIAGNOSIS — F3176 Bipolar disorder, in full remission, most recent episode depressed: Secondary | ICD-10-CM

## 2021-07-01 DIAGNOSIS — Z79899 Other long term (current) drug therapy: Secondary | ICD-10-CM

## 2021-07-03 DIAGNOSIS — Z79899 Other long term (current) drug therapy: Secondary | ICD-10-CM | POA: Diagnosis not present

## 2021-07-03 DIAGNOSIS — E291 Testicular hypofunction: Secondary | ICD-10-CM | POA: Diagnosis not present

## 2021-07-03 DIAGNOSIS — R7989 Other specified abnormal findings of blood chemistry: Secondary | ICD-10-CM | POA: Diagnosis not present

## 2021-07-03 DIAGNOSIS — F3176 Bipolar disorder, in full remission, most recent episode depressed: Secondary | ICD-10-CM | POA: Diagnosis not present

## 2021-07-04 LAB — TESTOSTERONE: Testosterone: 565 ng/dL (ref 250–827)

## 2021-07-04 LAB — TSH: TSH: 4.12 mIU/L (ref 0.40–4.50)

## 2021-07-04 LAB — LITHIUM LEVEL: Lithium Lvl: 0.4 mmol/L — ABNORMAL LOW (ref 0.6–1.2)

## 2021-07-04 NOTE — Progress Notes (Signed)
Tell him his lithium level is in the low normal range just like it was last year and has not changed.  His other labs were normal.  His testosterone level was 565 which is right in the middle of the normal range.  He wanted that information.

## 2021-07-06 NOTE — Progress Notes (Signed)
Pt informed

## 2021-08-02 DIAGNOSIS — M9901 Segmental and somatic dysfunction of cervical region: Secondary | ICD-10-CM | POA: Diagnosis not present

## 2021-08-02 DIAGNOSIS — M9903 Segmental and somatic dysfunction of lumbar region: Secondary | ICD-10-CM | POA: Diagnosis not present

## 2021-08-20 DIAGNOSIS — M545 Low back pain, unspecified: Secondary | ICD-10-CM | POA: Diagnosis not present

## 2021-08-29 DIAGNOSIS — M5451 Vertebrogenic low back pain: Secondary | ICD-10-CM | POA: Diagnosis not present

## 2021-10-10 DIAGNOSIS — Z1331 Encounter for screening for depression: Secondary | ICD-10-CM | POA: Diagnosis not present

## 2021-10-10 DIAGNOSIS — R001 Bradycardia, unspecified: Secondary | ICD-10-CM | POA: Diagnosis not present

## 2021-10-10 DIAGNOSIS — Z0000 Encounter for general adult medical examination without abnormal findings: Secondary | ICD-10-CM | POA: Diagnosis not present

## 2021-10-10 DIAGNOSIS — Z6824 Body mass index (BMI) 24.0-24.9, adult: Secondary | ICD-10-CM | POA: Diagnosis not present

## 2021-10-10 DIAGNOSIS — K635 Polyp of colon: Secondary | ICD-10-CM | POA: Diagnosis not present

## 2021-10-10 DIAGNOSIS — F1021 Alcohol dependence, in remission: Secondary | ICD-10-CM | POA: Diagnosis not present

## 2021-10-15 DIAGNOSIS — R7989 Other specified abnormal findings of blood chemistry: Secondary | ICD-10-CM | POA: Diagnosis not present

## 2021-11-06 DIAGNOSIS — R7989 Other specified abnormal findings of blood chemistry: Secondary | ICD-10-CM | POA: Diagnosis not present

## 2021-11-08 DIAGNOSIS — N3943 Post-void dribbling: Secondary | ICD-10-CM | POA: Diagnosis not present

## 2021-11-08 DIAGNOSIS — Z3009 Encounter for other general counseling and advice on contraception: Secondary | ICD-10-CM | POA: Diagnosis not present

## 2021-11-08 DIAGNOSIS — N401 Enlarged prostate with lower urinary tract symptoms: Secondary | ICD-10-CM | POA: Diagnosis not present

## 2021-11-30 DIAGNOSIS — R7989 Other specified abnormal findings of blood chemistry: Secondary | ICD-10-CM | POA: Diagnosis not present

## 2021-12-11 DIAGNOSIS — H61002 Unspecified perichondritis of left external ear: Secondary | ICD-10-CM | POA: Diagnosis not present

## 2021-12-11 DIAGNOSIS — L814 Other melanin hyperpigmentation: Secondary | ICD-10-CM | POA: Diagnosis not present

## 2021-12-11 DIAGNOSIS — D1801 Hemangioma of skin and subcutaneous tissue: Secondary | ICD-10-CM | POA: Diagnosis not present

## 2021-12-12 DIAGNOSIS — Z302 Encounter for sterilization: Secondary | ICD-10-CM | POA: Diagnosis not present

## 2021-12-27 DIAGNOSIS — E291 Testicular hypofunction: Secondary | ICD-10-CM | POA: Diagnosis not present

## 2022-01-25 DIAGNOSIS — R7989 Other specified abnormal findings of blood chemistry: Secondary | ICD-10-CM | POA: Diagnosis not present

## 2022-01-25 DIAGNOSIS — E291 Testicular hypofunction: Secondary | ICD-10-CM | POA: Diagnosis not present

## 2022-01-30 ENCOUNTER — Other Ambulatory Visit: Payer: Self-pay | Admitting: Psychiatry

## 2022-01-30 DIAGNOSIS — Z79899 Other long term (current) drug therapy: Secondary | ICD-10-CM

## 2022-01-30 DIAGNOSIS — F3176 Bipolar disorder, in full remission, most recent episode depressed: Secondary | ICD-10-CM

## 2022-02-25 DIAGNOSIS — E291 Testicular hypofunction: Secondary | ICD-10-CM | POA: Diagnosis not present

## 2022-03-27 DIAGNOSIS — R7989 Other specified abnormal findings of blood chemistry: Secondary | ICD-10-CM | POA: Diagnosis not present

## 2022-03-27 DIAGNOSIS — E291 Testicular hypofunction: Secondary | ICD-10-CM | POA: Diagnosis not present

## 2022-04-06 ENCOUNTER — Other Ambulatory Visit: Payer: Self-pay

## 2022-04-06 ENCOUNTER — Emergency Department (HOSPITAL_BASED_OUTPATIENT_CLINIC_OR_DEPARTMENT_OTHER)
Admission: EM | Admit: 2022-04-06 | Discharge: 2022-04-06 | Disposition: A | Discharge status: Home / Self Care | Payer: BC Managed Care – PPO | Attending: Emergency Medicine | Admitting: Emergency Medicine

## 2022-04-06 ENCOUNTER — Encounter (HOSPITAL_BASED_OUTPATIENT_CLINIC_OR_DEPARTMENT_OTHER): Payer: Self-pay | Admitting: Emergency Medicine

## 2022-04-06 DIAGNOSIS — H6691 Otitis media, unspecified, right ear: Secondary | ICD-10-CM | POA: Insufficient documentation

## 2022-04-06 DIAGNOSIS — H6121 Impacted cerumen, right ear: Secondary | ICD-10-CM

## 2022-04-06 DIAGNOSIS — H6690 Otitis media, unspecified, unspecified ear: Secondary | ICD-10-CM

## 2022-04-06 DIAGNOSIS — H9201 Otalgia, right ear: Secondary | ICD-10-CM | POA: Diagnosis not present

## 2022-04-06 MED ORDER — CARBAMIDE PEROXIDE 6.5 % OT SOLN: 5.0000 [drp] | Freq: Every day | OTIC | 0 refills | Status: DC | PRN

## 2022-04-06 MED ORDER — AMOXICILLIN-POT CLAVULANATE 875-125 MG PO TABS: 1.0000 | ORAL_TABLET | Freq: Two times a day (BID) | ORAL | 0 refills | Status: DC

## 2022-04-06 NOTE — ED Triage Notes (Signed)
  Patient comes in with a 2 day history of R ear ache.  Patient endorses pain when chewing.  Has used hydrogen peroxide to irrigate ear with no relief.  Patient states he has been in and out of the pool for the last few days.  Pain 5/10, achy in R ear.

## 2022-04-06 NOTE — ED Provider Notes (Signed)
[facility] EMERGENCY DEPT Provider Note   CSN: [id number] Arrival date & time: 04/06/22  2426     History  Chief Complaint  Patient presents with   Otalgia    Gregory Hardy is a 51 y.o. male.  Gregory Hardy is a 51 y.o. male with a history of bipolar disorder, who presents to the ED for evaluation of right ear pain.  Pain has been present for 2 days he describes it as an aching sensation.  Pain worse with chewing.  He reports that he has been in the pool a lot recently and was worried he had swimmer's ear and tried rinsing his ear with hydrogen peroxide with no relief.  No drainage from the ear.  No change in hearing.  No fevers or chills.  No sinus pressure or nasal congestion.  The history is provided by the patient and medical records.  Otalgia      Home Medications Prior to Admission medications   Medication Sig Start Date End Date Taking? Authorizing Provider  amoxicillin-clavulanate (AUGMENTIN) 875-125 MG tablet Take 1 tablet by mouth every 12 (twelve) hours. 04/06/22  Yes Dartha Lodge, PA-C  carbamide peroxide (DEBROX) 6.5 % OTIC solution Place 5 drops into both ears daily as needed (Wax build up). 04/06/22  Yes Dartha Lodge, PA-C  ibuprofen (ADVIL,MOTRIN) 200 MG tablet Take 800 mg by mouth every 6 (six) hours as needed for moderate pain. Patient not taking: Reported on 06/27/2021    [provider]  lithium carbonate (ESKALITH) 450 MG CR tablet TAKE 3 TABLETS BY MOUTH EVERY DAY 01/30/22   Cottle, Steva Ready., MD  Multiple Vitamin (MULTIVITAMIN) tablet Take 1 tablet by mouth daily.    [provider]  tamsulosin (FLOMAX) 0.4 MG CAPS capsule Take 0.4 mg by mouth daily. Patient not taking: Reported on 06/27/2021 08/07/18   [provider]      Allergies    Patient has no known allergies.    Review of Systems   Review of Systems  HENT:  Positive for ear pain.     Physical Exam Updated Vital Signs BP 120/82 (BP  Location: Right Arm)   Pulse (!) 48   Temp 98.4 F (36.9 C)   Resp 16   Ht 5\' 11"  (1.803 m)   Wt 83.9 kg   SpO2 95%   BMI 25.80 kg/m  Physical Exam Vitals and nursing note reviewed.  Constitutional:      General: He is not in acute distress.    Appearance: Normal appearance. He is well-developed. He is not ill-appearing or diaphoretic.  HENT:     Head: Normocephalic and atraumatic.     Ears:     Comments: Left ear canal clear, TM without erythema, good light reflection. Right ear canal completely occluded with dried earwax, unable to visualize TM, visible canal without erythema, edema or drainage. Eyes:     General:        Right eye: No discharge.        Left eye: No discharge.  Pulmonary:     Effort: Pulmonary effort is normal. No respiratory distress.  Neurological:     Mental Status: He is alert and oriented to person, place, and time.     Coordination: Coordination normal.  Psychiatric:        Mood and Affect: Mood normal.        Behavior: Behavior normal.     ED Results / Procedures / Treatments   Labs (all  labs ordered are listed, but only abnormal results are displayed) Labs Reviewed - No data to display  EKG None  Radiology No results found.  Procedures .Ear Cerumen Removal  Date/Time: 04/06/2022 9:48 PM  Performed by: Dartha Lodge, PA-C Authorized by: Dartha Lodge, PA-C   Consent:    Consent obtained:  Verbal   Consent given by:  Patient   Risks, benefits, and alternatives were discussed: yes     Risks discussed:  Bleeding, infection, pain and TM perforation   Alternatives discussed:  No treatment Universal protocol:    Procedure explained and questions answered to patient or proxy's satisfaction: yes     Patient identity confirmed:  Verbally with patient Procedure details:    Location:  R ear   Procedure type: irrigation     Procedure outcomes: cerumen removed   Post-procedure details:    Inspection:  TM intact (TM erythematous and  bulging)   Procedure completion:  Tolerated well, no immediate complications     Medications Ordered in ED Medications - No data to display  ED Course/ Medical Decision Making/ A&P                           Medical Decision Making Risk OTC drugs. Prescription drug management.   51 year old male presents with right ear pain for 2 days.  Differential includes otitis externa, otitis media, foreign body, cerumen impaction, TM perforation.  On exam patient has dried wax occluding the canal.  Cerumen impaction cleared with irrigation, TM is intact without perforation but is erythematous and bulging, the canal itself is mildly erythematous but without edema or drainage.  Exam seems most consistent with otitis media.  Will treat with systemic antibiotics.  Debrox drops also prescribed.  Instructed on follow-up with PCP and return precautions.  Discharged home in good condition.        Final Clinical Impression(s) / ED Diagnoses Final diagnoses:  Acute otitis media, unspecified otitis media type  Impacted cerumen of right ear    Rx / DC Orders ED Discharge Orders          Ordered    amoxicillin-clavulanate (AUGMENTIN) 875-125 MG tablet  Every 12 hours        04/06/22 2039    carbamide peroxide (DEBROX) 6.5 % OTIC solution  Daily PRN        04/06/22 2039              Dartha Lodge, PA-C 04/06/22 2151    Virgina Norfolk, DO 04/06/22 2333

## 2022-04-06 NOTE — Discharge Instructions (Signed)
Take antibiotics twice daily for the next 7 days to treat your infection.  You can use Debrox drops in the ears as needed to help dissolve any wax buildup.  Follow-up with your doctor if ear pain is not improving.

## 2022-04-06 NOTE — ED Notes (Signed)
Most of the wax was removed from the right ear with saline. No obvious issue when performed.

## 2022-04-06 NOTE — ED Notes (Signed)
2 days of ear pain. Pain increase when eating on the right side. Was in the pool recently prior to the pain. No obvious redness or drainage on the outside of the ear. No other complaints.

## 2022-07-02 ENCOUNTER — Ambulatory Visit: Payer: BC Managed Care – PPO | Admitting: Psychiatry

## 2022-07-06 ENCOUNTER — Other Ambulatory Visit: Payer: Self-pay | Admitting: Psychiatry

## 2022-07-06 DIAGNOSIS — Z79899 Other long term (current) drug therapy: Secondary | ICD-10-CM

## 2022-07-06 DIAGNOSIS — F3176 Bipolar disorder, in full remission, most recent episode depressed: Secondary | ICD-10-CM

## 2022-07-10 DIAGNOSIS — R0781 Pleurodynia: Secondary | ICD-10-CM | POA: Diagnosis not present

## 2022-07-10 DIAGNOSIS — R0789 Other chest pain: Secondary | ICD-10-CM | POA: Diagnosis not present

## 2022-08-13 ENCOUNTER — Encounter: Payer: Self-pay | Admitting: Psychiatry

## 2022-08-13 ENCOUNTER — Ambulatory Visit (INDEPENDENT_AMBULATORY_CARE_PROVIDER_SITE_OTHER): Payer: BC Managed Care – PPO | Admitting: Psychiatry

## 2022-08-13 DIAGNOSIS — Z79899 Other long term (current) drug therapy: Secondary | ICD-10-CM | POA: Diagnosis not present

## 2022-08-13 DIAGNOSIS — F1021 Alcohol dependence, in remission: Secondary | ICD-10-CM

## 2022-08-13 DIAGNOSIS — F3176 Bipolar disorder, in full remission, most recent episode depressed: Secondary | ICD-10-CM

## 2022-08-13 NOTE — Progress Notes (Signed)
Gregory Hardy [id number] [birth year] 51 y.o.  Subjective:   Patient ID:  Gregory Hardy is a 51 y.o. (DOB December 17, 1970) male.  Chief Complaint:  Chief Complaint  Patient presents with   Follow-up    Bipolar 1 disorder, depressed, full remission (HCC)   Depression    HPI [name] presents to the office today for follow-up of bipolar disorder.   He was last seen 06/2019.  No meds were changed.    06/27/2020 appt with the following noted. I've been good.  Things been pretty stable.  Still in recovery, 5 years next month.  Goes to a couple of meetings a week.  He feels his brain has recovered from the years of alcohol abuse. Focuses a lot on exercise and nutrition.  Gym daily for a couple of years. 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 Less anger.  Done ok with less lithium for months..  Uses CBT coping skills.  No mood swings of significance.  No sugar and only water. Good PE with labs. Plan: continue lithum 900  06/27/2021 appt needed: 5 kids. Doing well.  I feel fantastic.  Exercise mandatory and it has dramatically helped mood and health.  Sober still.  Making good money.  Worked hard.  Still playing golf.  No sig conflicts.   Patient reports stable mood and denies depressed or irritable moods.  Patient denies any recent difficulty with anxiety.  Patient denies difficulty with sleep initiation or maintenance. Denies appetite disturbance.  Patient reports that energy and motivation have been good.  Patient denies any difficulty with concentration.  Patient denies any suicidal ideation. No SE. Consistent with meds.  08/13/22 appt noted: Still good stability.  No mood swings, mania or depression. Tolerating meds. Continues lithium 900 mg daily.  No SE Doesn't bother watching movies much now. Still exercise. 7 year sober with chip.  Wife supportive.   5 kids.  From 1 to 71 yo. Going to gym. 50 yo dx ODD and dealing with that.  Before last relapse  He had 10  years sobriety, differently this time better exercise and diet.  Lives in [place].  Doesn't feel the effects from the lithium but when out for 2 days and he felt more angry and frustrated.   Questions about smoking cessation.   Review of Systems:  Review of Systems  Neurological:  Negative for tremors and weakness.  Psychiatric/Behavioral:  Negative for agitation, behavioral problems, confusion, decreased concentration, dysphoric mood, hallucinations, self-injury, sleep disturbance and suicidal ideas. The patient is not nervous/anxious and is not hyperactive.     Medications: I have reviewed the patient's current medications.  Current Outpatient Medications  Medication Sig Dispense Refill   carbamide peroxide (DEBROX) 6.5 % OTIC solution Place 5 drops into both ears daily as needed (Wax build up). 15 mL 0   ibuprofen (ADVIL,MOTRIN) 200 MG tablet Take 800 mg by mouth every 6 (six) hours as needed for moderate pain.     lithium carbonate (ESKALITH) 450 MG CR tablet TAKE 3 TABLETS BY MOUTH EVERY DAY (Patient taking differently: 2 tabs daily) 270 tablet 1   Multiple Vitamin (MULTIVITAMIN) tablet Take 1 tablet by mouth daily.     tamsulosin (FLOMAX) 0.4 MG CAPS capsule Take 0.4 mg by mouth daily.  11   No current facility-administered medications for this visit.    Medication Side Effects: None  Allergies: No Known Allergies  Past Medical History:  Diagnosis Date   Anxiety    Bipolar 1 disorder (  HCC)    Opiate addiction (HCC)     History reviewed. No pertinent family history.  Social History   Socioeconomic History   Marital status: Married    Spouse name: Not on file   Number of children: Not on file   Years of education: Not on file   Highest education level: Not on file  Occupational History   Not on file  Tobacco Use   Smoking status: Every Day    Packs/day: 0.50    Types: E-cigarettes, Cigarettes   Smokeless tobacco: Never  Substance and Sexual Activity    Alcohol use: Yes    Comment: At least 6 drinks daily. Last drink: 22:30 last night.    Drug use: Yes    Types: Marijuana    Comment: History of heroin. Last used: "a few days". "Maybe Thursday or Friday of last week"   Sexual activity: Not on file  Other Topics Concern   Not on file  Social History Narrative   Not on file   Social Determinants of Health   Financial Resource Strain: Not on file  Food Insecurity: Not on file  Transportation Needs: Not on file  Physical Activity: Not on file  Stress: Not on file  Social Connections: Not on file  Intimate Partner Violence: Not on file    Past Medical History, Surgical history, Social history, and Family history were reviewed and updated as appropriate.   Please see review of systems for further details on the patient's review from today.   Objective:   Physical Exam:  There were no vitals taken for this visit.  Physical Exam Constitutional:      General: He is not in acute distress.    Appearance: He is well-developed.  Musculoskeletal:        General: No deformity.  Neurological:     Mental Status: He is alert and oriented to person, place, and time.     Motor: No tremor.     Coordination: Coordination normal.     Gait: Gait normal.  Psychiatric:        Attention and Perception: He is attentive. He does not perceive auditory hallucinations.        Mood and Affect: Mood is not anxious or depressed. Affect is not labile, blunt, angry or inappropriate.        Speech: Speech normal. Speech is not rapid and pressured.        Behavior: Behavior normal. Behavior is not agitated.        Thought Content: Thought content normal. Thought content does not include homicidal or suicidal ideation. Thought content does not include homicidal or suicidal plan.        Cognition and Memory: Cognition normal.        Judgment: Judgment normal.     Comments: Insight intact. No auditory or visual hallucinations. No delusions.  No mania.      Lab Review:     Component Value Date/Time   NA 135 08/07/2015 0339   K 3.3 (L) 08/07/2015 0339   CL 100 (L) 08/07/2015 0339   CO2 23 08/07/2015 0339   GLUCOSE 93 08/07/2015 0339   BUN 10 08/07/2015 0339   CREATININE 0.92 08/07/2015 0339   CALCIUM 9.4 08/07/2015 0339   PROT 8.0 08/07/2015 0339   ALBUMIN 4.6 08/07/2015 0339   AST 21 08/07/2015 0339   ALT 21 08/07/2015 0339   ALKPHOS 57 08/07/2015 0339   BILITOT 0.9 08/07/2015 0339   GFRNONAA >60 08/07/2015 0347  GFRAA >60 08/07/2015 0339       Component Value Date/Time   WBC 12.3 (H) 08/07/2015 0339   RBC 4.66 08/07/2015 0339   HGB 15.3 08/07/2015 0339   HCT 44.5 08/07/2015 0339   PLT 262 08/07/2015 0339   MCV 95.5 08/07/2015 0339   MCH 32.8 08/07/2015 0339   MCHC 34.4 08/07/2015 0339   RDW 12.6 08/07/2015 0339   LYMPHSABS 2.8 08/07/2015 0339   MONOABS 1.1 (H) 08/07/2015 0339   EOSABS 0.2 08/07/2015 0339   BASOSABS 0.1 08/07/2015 0339    Lithium Lvl  Date Value Ref Range Status  07/03/2021 0.4 (L) 0.6 - 1.2 mmol/L Final   07/03/21 lithium 0.4 at 900 mg HS with normal testosterone and TSH  No results found for: "PHENYTOIN", "PHENOBARB", "VALPROATE", "CBMZ"   lithium 03/09/18 = 0.4 at 900 mg daily. Normal BMP and TSH.  .res Assessment: Plan:    Bipolar 1 disorder, depressed, full remission (HCC) - Plan: Lithium level, Basic metabolic panel, TSH  Lithium use - Plan: Lithium level, Basic metabolic panel, TSH  Alcohol dependence in remission (HCC)   Meds stable and mood and mood without problems.  Stable on low dose lithium.  Disc risks with low dosage. Counseled patient regarding potential benefits, risks, and side effects of lithium to include potential risk of lithium affecting thyroid and renal function.  Discussed need for periodic lab monitoring to determine drug level and to assess for potential adverse effects.  Counseled patient regarding signs and symptoms of lithium toxicity and advised that they  notify office immediately or seek urgent medical attention if experiencing these signs and symptoms.  Patient advised to contact office with any questions or concerns.  Repeat lithium labs Continue lithium CR 900 HS  However he has been very stable for a number of years.  Recent increased irritability when he missed a few doses and he is committed to staying on the medication.  Pursuit of sobriety has been maintained for 7 years..  FU 12 months because stable on low-dose lithium  Meredith Staggers, MD, DFAPA   Please see After Visit Summary for patient specific instructions.  Future Appointments  Date Time Provider Department Center  08/19/2023 11:00 AM Cottle, Steva Ready., MD [facility] None    Orders Placed This Encounter  Procedures   Lithium level   Basic metabolic panel   TSH       -------------------------------

## 2022-10-10 DIAGNOSIS — J0120 Acute ethmoidal sinusitis, unspecified: Secondary | ICD-10-CM | POA: Diagnosis not present

## 2022-10-10 DIAGNOSIS — Z6825 Body mass index (BMI) 25.0-25.9, adult: Secondary | ICD-10-CM | POA: Diagnosis not present

## 2022-11-07 DIAGNOSIS — M25512 Pain in left shoulder: Secondary | ICD-10-CM | POA: Diagnosis not present

## 2022-11-07 DIAGNOSIS — M542 Cervicalgia: Secondary | ICD-10-CM | POA: Diagnosis not present

## 2022-11-08 ENCOUNTER — Telehealth: Payer: Self-pay | Admitting: Psychiatry

## 2022-11-08 NOTE — Telephone Encounter (Signed)
Gregory Hardy was in the office on 2/16 and while here requested that you give a call sometime after you return.  He wants to speak with you again about his oldest son.  He indicated that he had spoke with before about him and wants to follow up on that conversation.  Please call [phone]

## 2022-11-19 DIAGNOSIS — F1021 Alcohol dependence, in remission: Secondary | ICD-10-CM | POA: Diagnosis not present

## 2022-11-19 DIAGNOSIS — R001 Bradycardia, unspecified: Secondary | ICD-10-CM | POA: Diagnosis not present

## 2022-11-19 DIAGNOSIS — Z1331 Encounter for screening for depression: Secondary | ICD-10-CM | POA: Diagnosis not present

## 2022-11-19 DIAGNOSIS — K635 Polyp of colon: Secondary | ICD-10-CM | POA: Diagnosis not present

## 2022-11-19 DIAGNOSIS — R7989 Other specified abnormal findings of blood chemistry: Secondary | ICD-10-CM | POA: Diagnosis not present

## 2022-11-19 DIAGNOSIS — Z0000 Encounter for general adult medical examination without abnormal findings: Secondary | ICD-10-CM | POA: Diagnosis not present

## 2022-11-19 DIAGNOSIS — E291 Testicular hypofunction: Secondary | ICD-10-CM | POA: Diagnosis not present

## 2022-11-19 DIAGNOSIS — E119 Type 2 diabetes mellitus without complications: Secondary | ICD-10-CM | POA: Diagnosis not present

## 2022-11-19 DIAGNOSIS — Z6825 Body mass index (BMI) 25.0-25.9, adult: Secondary | ICD-10-CM | POA: Diagnosis not present

## 2022-11-19 DIAGNOSIS — E663 Overweight: Secondary | ICD-10-CM | POA: Diagnosis not present

## 2022-11-19 NOTE — Telephone Encounter (Signed)
RTC disc son's defiance px at 16.  He's in [place]

## 2022-12-13 DIAGNOSIS — L308 Other specified dermatitis: Secondary | ICD-10-CM | POA: Diagnosis not present

## 2022-12-13 DIAGNOSIS — L814 Other melanin hyperpigmentation: Secondary | ICD-10-CM | POA: Diagnosis not present

## 2022-12-13 DIAGNOSIS — H61002 Unspecified perichondritis of left external ear: Secondary | ICD-10-CM | POA: Diagnosis not present

## 2022-12-13 DIAGNOSIS — D1801 Hemangioma of skin and subcutaneous tissue: Secondary | ICD-10-CM | POA: Diagnosis not present

## 2023-01-22 DIAGNOSIS — R946 Abnormal results of thyroid function studies: Secondary | ICD-10-CM | POA: Diagnosis not present

## 2023-02-23 ENCOUNTER — Other Ambulatory Visit: Payer: Self-pay | Admitting: Psychiatry

## 2023-02-23 DIAGNOSIS — Z79899 Other long term (current) drug therapy: Secondary | ICD-10-CM

## 2023-02-23 DIAGNOSIS — F3176 Bipolar disorder, in full remission, most recent episode depressed: Secondary | ICD-10-CM

## 2023-02-23 NOTE — Telephone Encounter (Signed)
Verify dose, last note says 900 mg

## 2023-02-27 NOTE — Telephone Encounter (Signed)
Patient reports taking 3 qd

## 2023-04-21 DIAGNOSIS — K4090 Unilateral inguinal hernia, without obstruction or gangrene, not specified as recurrent: Secondary | ICD-10-CM | POA: Diagnosis not present

## 2023-04-21 DIAGNOSIS — R946 Abnormal results of thyroid function studies: Secondary | ICD-10-CM | POA: Diagnosis not present

## 2023-04-21 DIAGNOSIS — Z6825 Body mass index (BMI) 25.0-25.9, adult: Secondary | ICD-10-CM | POA: Diagnosis not present

## 2023-04-24 ENCOUNTER — Ambulatory Visit: Payer: Self-pay | Admitting: Surgery

## 2023-04-24 DIAGNOSIS — K4090 Unilateral inguinal hernia, without obstruction or gangrene, not specified as recurrent: Secondary | ICD-10-CM | POA: Diagnosis not present

## 2023-04-24 NOTE — H&P (View-Only) (Signed)
 Gregory Hardy [id number]   Referring Provider:  Corrie Mckusick, MD   Subjective   Chief Complaint: No chief complaint on file.     History of Present Illness:    Otherwise healthy and very active 52 year old man presents for evaluation of a left inguinal hernia.  He has noted a knot in his left groin for about a month.  This is associated with discomfort and aching mild pain.  It has been increasing in size.  Gets larger with coughing and more painful when he is golfing, walking the course and swinging the club.  He does some moderate lifting and has not found that to be significantly aggravating activity.   He denies any previous abdominal surgery or hernia surgery.  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  Referral includes lab work from March of this year within normal CBC, fattening 1.33, potassium 5.1, otherwise unremarkable CMP, normal cholesterol panel, ASH 5.3/slightly elevated, hemoglobin A1c 4.8 in February  Review of Systems: A complete review of systems was obtained from the patient.  I have reviewed this information and discussed as appropriate with the patient.  See HPI as well for other ROS.   Medical History: Past Medical History:  Diagnosis Date   Alcohol abuse     There is no problem list on file for this patient.   Past Surgical History:  Procedure Laterality Date   No surgeries as of 03/14/2017       No Known Allergies  Current Outpatient Medications on File Prior to Visit  Medication Sig Dispense Refill   glucosamine sulfate (GLUCOSAMINE ORAL) Take by mouth.     lithium carbonate 450 MG ER tablet Take 900 mg by mouth once daily.    0   multivitamin tablet Take 1 tablet by mouth once daily.     naproxen sodium (ALEVE, ANAPROX) 220 MG tablet Take 440 mg by mouth 2 (two) times daily with meals.     omega-3 fatty acids/fish oil 340-1,000 mg capsule Take 1 capsule by mouth 2 (two) times daily.     No current facility-administered medications on file prior to  visit.    No family history on file.   Social History   Tobacco Use  Smoking Status Former   Current packs/day: 0.00   Types: Cigarettes   Quit date: 03/15/2015   Years since quitting: 8.1  Smokeless Tobacco Never     Social History   Socioeconomic History   Marital status: Married  Tobacco Use   Smoking status: Former    Current packs/day: 0.00    Types: Cigarettes    Quit date: 03/15/2015    Years since quitting: 8.1   Smokeless tobacco: Never  Vaping Use   Vaping status: Never Used  Substance and Sexual Activity   Alcohol use: No   Drug use: No    Objective:    There were no vitals filed for this visit.  There is no height or weight on file to calculate BMI.  Gen: A&Ox3, no distress  Unlabored respirations Abd s/nt/nd, there is a small reducible left inguinal hernia.  No hernia on the right. Psych: appropriate mood and affect, normal insight/judgment intact  Skin: warm and dry    Assessment and Plan:  Diagnoses and all orders for this visit:  Non-recurrent inguinal hernia without obstruction or gangrene, unspecified laterality  We discussed the relevant anatomy and we discussed options for repair.  I recommend an open approach and went over the technique of the procedure.  Discussed risks  of bleeding, infection, pain, scarring, injury to structures in the area including nerves, blood vessels, bowel, bladder, risk of chronic pain, hernia recurrence, risk of seroma or hematoma, urinary retention, and risks of general anesthesia including cardiovascular, pulmonary, and thromboembolic complications.  We discussed typical postop recovery, timeline, and activity limitations.  We also discussed the option of ongoing observation, with high rate of ultimately returning for surgery and risk of increasing size/symptoms from the hernia as well as incarceration/strangulation and went over symptoms that should prompt him to seek emergency treatment.  Questions were come and  answered to the patient's satisfaction. Patient wishes to proceed with scheduling.     CHELSEA Carlye Grippe, MD

## 2023-04-24 NOTE — H&P (Signed)
Erlin Lawhorn [id number]   Referring Provider:  Corrie Mckusick, MD   Subjective   Chief Complaint: No chief complaint on file.     History of Present Illness:    Otherwise healthy and very active 52 year old man presents for evaluation of a left inguinal hernia.  He has noted a knot in his left groin for about a month.  This is associated with discomfort and aching mild pain.  It has been increasing in size.  Gets larger with coughing and more painful when he is golfing, walking the course and swinging the club.  He does some moderate lifting and has not found that to be significantly aggravating activity.   He denies any previous abdominal surgery or hernia surgery.  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  Referral includes lab work from March of this year within normal CBC, fattening 1.33, potassium 5.1, otherwise unremarkable CMP, normal cholesterol panel, ASH 5.3/slightly elevated, hemoglobin A1c 4.8 in February  Review of Systems: A complete review of systems was obtained from the patient.  I have reviewed this information and discussed as appropriate with the patient.  See HPI as well for other ROS.   Medical History: Past Medical History:  Diagnosis Date   Alcohol abuse     There is no problem list on file for this patient.   Past Surgical History:  Procedure Laterality Date   No surgeries as of 03/14/2017       No Known Allergies  Current Outpatient Medications on File Prior to Visit  Medication Sig Dispense Refill   glucosamine sulfate (GLUCOSAMINE ORAL) Take by mouth.     lithium carbonate 450 MG ER tablet Take 900 mg by mouth once daily.    0   multivitamin tablet Take 1 tablet by mouth once daily.     naproxen sodium (ALEVE, ANAPROX) 220 MG tablet Take 440 mg by mouth 2 (two) times daily with meals.     omega-3 fatty acids/fish oil 340-1,000 mg capsule Take 1 capsule by mouth 2 (two) times daily.     No current facility-administered medications on file prior to  visit.    No family history on file.   Social History   Tobacco Use  Smoking Status Former   Current packs/day: 0.00   Types: Cigarettes   Quit date: 03/15/2015   Years since quitting: 8.1  Smokeless Tobacco Never     Social History   Socioeconomic History   Marital status: Married  Tobacco Use   Smoking status: Former    Current packs/day: 0.00    Types: Cigarettes    Quit date: 03/15/2015    Years since quitting: 8.1   Smokeless tobacco: Never  Vaping Use   Vaping status: Never Used  Substance and Sexual Activity   Alcohol use: No   Drug use: No    Objective:    There were no vitals filed for this visit.  There is no height or weight on file to calculate BMI.  Gen: A&Ox3, no distress  Unlabored respirations Abd s/nt/nd, there is a small reducible left inguinal hernia.  No hernia on the right. Psych: appropriate mood and affect, normal insight/judgment intact  Skin: warm and dry    Assessment and Plan:  Diagnoses and all orders for this visit:  Non-recurrent inguinal hernia without obstruction or gangrene, unspecified laterality  We discussed the relevant anatomy and we discussed options for repair.  I recommend an open approach and went over the technique of the procedure.  Discussed risks  of bleeding, infection, pain, scarring, injury to structures in the area including nerves, blood vessels, bowel, bladder, risk of chronic pain, hernia recurrence, risk of seroma or hematoma, urinary retention, and risks of general anesthesia including cardiovascular, pulmonary, and thromboembolic complications.  We discussed typical postop recovery, timeline, and activity limitations.  We also discussed the option of ongoing observation, with high rate of ultimately returning for surgery and risk of increasing size/symptoms from the hernia as well as incarceration/strangulation and went over symptoms that should prompt him to seek emergency treatment.  Questions were come and  answered to the patient's satisfaction. Patient wishes to proceed with scheduling.     Eliga Arvie Carlye Grippe, MD

## 2023-04-29 NOTE — Pre-Procedure Instructions (Signed)
Surgical Instructions   Your procedure is scheduled on Monday, August 12th. Report to [facility] Main Entrance "A" at 05:30 A.M., then check in with the Admitting office. Any questions or running late day of surgery: call [phone]  Questions prior to your surgery date: call [phone], Monday-Friday, 8am-4pm. If you experience any cold or flu symptoms such as cough, fever, chills, shortness of breath, etc. between now and your scheduled surgery, please notify us at the above number.     Remember:  Do not eat or drink after midnight the night before your surgery    Take these medicines the morning of surgery with A SIP OF WATER  levothyroxine (SYNTHROID)  lithium carbonate (ESKALITH)  tamsulosin (FLOMAX)    One week prior to surgery, STOP taking any Aspirin (unless otherwise instructed by your surgeon) Aleve, Naproxen, Ibuprofen, Motrin, Advil, Goody's, BC's, all herbal medications, fish oil, and non-prescription vitamins.                     Do NOT Smoke (Tobacco/Vaping) for 24 hours prior to your procedure.  If you use a CPAP at night, you may bring your mask/headgear for your overnight stay.   You will be asked to remove any contacts, glasses, piercing's, hearing aid's, dentures/partials prior to surgery. Please bring cases for these items if needed.    Patients discharged the day of surgery will not be allowed to drive home, and someone needs to stay with them for 24 hours.  SURGICAL WAITING ROOM VISITATION Patients may have no more than 2 support people in the waiting area - these visitors may rotate.   Pre-op nurse will coordinate an appropriate time for 1 ADULT support person, who may not rotate, to accompany patient in pre-op.  Children under the age of 69 must have an adult with them who is not the patient and must remain in the main waiting area with an adult.  If the patient needs to stay at the hospital during part of their recovery, the visitor guidelines for  inpatient rooms apply.  Please refer to the [facility] website for the visitor guidelines for any additional information.   If you received a COVID test during your pre-op visit  it is requested that you wear a mask when out in public, stay away from anyone that may not be feeling well and notify your surgeon if you develop symptoms. If you have been in contact with anyone that has tested positive in the last 10 days please notify you surgeon.      Pre-operative CHG Bathing Instructions   You can play a key role in reducing the risk of infection after surgery. Your skin needs to be as free of germs as possible. You can reduce the number of germs on your skin by washing with CHG (chlorhexidine gluconate) soap before surgery. CHG is an antiseptic soap that kills germs and continues to kill germs even after washing.   DO NOT use if you have an allergy to chlorhexidine/CHG or antibacterial soaps. If your skin becomes reddened or irritated, stop using the CHG and notify one of our RNs at [phone].              TAKE A SHOWER THE NIGHT BEFORE SURGERY AND THE DAY OF SURGERY    Please keep in mind the following:  DO NOT shave, including legs and underarms, 48 hours prior to surgery.   You may shave your face before/day of surgery.  Place clean sheets  on your bed the night before surgery Use a clean washcloth (not used since being washed) for each shower. DO NOT sleep with pet's night before surgery.  CHG Shower Instructions:  If you choose to wash your hair and private area, wash first with your normal shampoo/soap.  After you use shampoo/soap, rinse your hair and body thoroughly to remove shampoo/soap residue.  Turn the water OFF and apply half the bottle of CHG soap to a CLEAN washcloth.  Apply CHG soap ONLY FROM YOUR NECK DOWN TO YOUR TOES (washing for 3-5 minutes)  DO NOT use CHG soap on face, private areas, open wounds, or sores.  Pay special attention to the area where your surgery  is being performed.  If you are having back surgery, having someone wash your back for you may be helpful. Wait 2 minutes after CHG soap is applied, then you may rinse off the CHG soap.  Pat dry with a clean towel  Put on clean pajamas    Additional instructions for the day of surgery: DO NOT APPLY any lotions, deodorants, cologne, or perfumes.   Do not wear jewelry or makeup Do not wear nail polish, gel polish, artificial nails, or any other type of covering on natural nails (fingers and toes) Do not bring valuables to the hospital. [facility] is not responsible for valuables/personal belongings. Put on clean/comfortable clothes.  Please brush your teeth.  Ask your nurse before applying any prescription medications to the skin.

## 2023-04-30 ENCOUNTER — Encounter (HOSPITAL_COMMUNITY)
Admission: RE | Admit: 2023-04-30 | Discharge: 2023-04-30 | Disposition: A | Discharge status: Home / Self Care | Payer: BC Managed Care – PPO | Source: Ambulatory Visit | Attending: Surgery | Admitting: Surgery

## 2023-04-30 ENCOUNTER — Encounter (HOSPITAL_COMMUNITY): Payer: Self-pay

## 2023-04-30 ENCOUNTER — Other Ambulatory Visit: Payer: Self-pay

## 2023-04-30 VITALS — BP 104/71 | HR 51 | Temp 97.8°F | Resp 18 | Ht 71.0 in | Wt 179.2 lb

## 2023-04-30 DIAGNOSIS — Z01818 Encounter for other preprocedural examination: Secondary | ICD-10-CM | POA: Diagnosis not present

## 2023-04-30 DIAGNOSIS — Z01812 Encounter for preprocedural laboratory examination: Secondary | ICD-10-CM | POA: Insufficient documentation

## 2023-04-30 HISTORY — DX: Hypothyroidism, unspecified: E03.9

## 2023-04-30 LAB — COMPREHENSIVE METABOLIC PANEL
ALT: 32 U/L (ref 0–44)
AST: 26 U/L (ref 15–41)
Albumin: 3.9 g/dL (ref 3.5–5.0)
Alkaline Phosphatase: 64 U/L (ref 38–126)
Anion gap: 8 (ref 5–15)
BUN: 21 mg/dL — ABNORMAL HIGH (ref 6–20)
CO2: 26 mmol/L (ref 22–32)
Calcium: 9.2 mg/dL (ref 8.9–10.3)
Chloride: 103 mmol/L (ref 98–111)
Creatinine, Ser: 1.2 mg/dL (ref 0.61–1.24)
GFR, Estimated: >60 mL/min (ref >60)
Glucose, Bld: 96 mg/dL (ref 70–99)
Potassium: 4.7 mmol/L (ref 3.5–5.1)
Sodium: 137 mmol/L (ref 135–145)
Total Bilirubin: 0.9 mg/dL (ref 0.3–1.2)
Total Protein: 7 g/dL (ref 6.5–8.1)

## 2023-04-30 LAB — CBC
HCT: 49 % (ref 39.0–52.0)
Hemoglobin: 16.2 g/dL (ref 13.0–17.0)
MCH: 31 pg (ref 26.0–34.0)
MCHC: 33.1 g/dL (ref 30.0–36.0)
MCV: 93.9 fL (ref 80.0–100.0)
Platelets: 219 10*3/uL (ref 150–400)
RBC: 5.22 MIL/uL (ref 4.22–5.81)
RDW: 13.1 % (ref 11.5–15.5)
WBC: 6.5 10*3/uL (ref 4.0–10.5)
nRBC: 0 % (ref 0.0–0.2)

## 2023-04-30 NOTE — Pre-Procedure Instructions (Signed)
Surgical Instructions   Your procedure is scheduled on Monday, August 12th. Report to [facility] Main Entrance "A" at 05:30 A.M., then check in with the Admitting office. Any questions or running late day of surgery: call [phone]  Questions prior to your surgery date: call [phone], Monday-Friday, 8am-4pm. If you experience any cold or flu symptoms such as cough, fever, chills, shortness of breath, etc. between now and your scheduled surgery, please notify us at the above number.     Remember:  Do not eat or drink after midnight the night before your surgery    Take these medicines the morning of surgery with A SIP OF WATER  levothyroxine (SYNTHROID)  lithium carbonate (ESKALITH) - pt takes at night. tamsulosin [facility])    One week prior to surgery, STOP taking any Aspirin (unless otherwise instructed by your surgeon) Aleve, Naproxen, Ibuprofen, Motrin, Advil, Goody's, BC's, all herbal medications, fish oil, and non-prescription vitamins.                     Do NOT Smoke (Tobacco/Vaping) for 24 hours prior to your procedure.  If you use a CPAP at night, you may bring your mask/headgear for your overnight stay.   You will be asked to remove any contacts, glasses, piercing's, hearing aid's, dentures/partials prior to surgery. Please bring cases for these items if needed.    Patients discharged the day of surgery will not be allowed to drive home, and someone needs to stay with them for 24 hours.  SURGICAL WAITING ROOM VISITATION Patients may have no more than 2 support people in the waiting area - these visitors may rotate.   Pre-op nurse will coordinate an appropriate time for 1 ADULT support person, who may not rotate, to accompany patient in pre-op.  Children under the age of 30 must have an adult with them who is not the patient and must remain in the main waiting area with an adult.  If the patient needs to stay at the hospital during part of their recovery, the  visitor guidelines for inpatient rooms apply.  Please refer to the [facility] website for the visitor guidelines for any additional information.   If you received a COVID test during your pre-op visit  it is requested that you wear a mask when out in public, stay away from anyone that may not be feeling well and notify your surgeon if you develop symptoms. If you have been in contact with anyone that has tested positive in the last 10 days please notify you surgeon.      Pre-operative CHG Bathing Instructions   You can play a key role in reducing the risk of infection after surgery. Your skin needs to be as free of germs as possible. You can reduce the number of germs on your skin by washing with CHG (chlorhexidine gluconate) soap before surgery. CHG is an antiseptic soap that kills germs and continues to kill germs even after washing.   DO NOT use if you have an allergy to chlorhexidine/CHG or antibacterial soaps. If your skin becomes reddened or irritated, stop using the CHG and notify one of our RNs at [phone].              TAKE A SHOWER THE NIGHT BEFORE SURGERY AND THE DAY OF SURGERY    Please keep in mind the following:  DO NOT shave, including legs and underarms, 48 hours prior to surgery.   You may shave your face before/day of surgery.  Place clean sheets on your bed the night before surgery Use a clean washcloth (not used since being washed) for each shower. DO NOT sleep with pet's night before surgery.  CHG Shower Instructions:  If you choose to wash your hair and private area, wash first with your normal shampoo/soap.  After you use shampoo/soap, rinse your hair and body thoroughly to remove shampoo/soap residue.  Turn the water OFF and apply half the bottle of CHG soap to a CLEAN washcloth.  Apply CHG soap ONLY FROM YOUR NECK DOWN TO YOUR TOES (washing for 3-5 minutes)  DO NOT use CHG soap on face, private areas, open wounds, or sores.  Pay special attention to the  area where your surgery is being performed.  If you are having back surgery, having someone wash your back for you may be helpful. Wait 2 minutes after CHG soap is applied, then you may rinse off the CHG soap.  Pat dry with a clean towel  Put on clean pajamas    Additional instructions for the day of surgery: DO NOT APPLY any lotions, deodorants, cologne, or perfumes.   Do not wear jewelry or makeup Do not wear nail polish, gel polish, artificial nails, or any other type of covering on natural nails (fingers and toes) Do not bring valuables to the hospital. [facility] is not responsible for valuables/personal belongings. Put on clean/comfortable clothes.  Please brush your teeth.  Ask your nurse before applying any prescription medications to the skin.

## 2023-04-30 NOTE — Progress Notes (Addendum)
PCP - [facility] Cardiologist - denies  PPM/ICD - dnies Device Orders -  Rep Notified -   Chest x-ray - NA EKG - na Stress Test - denies ECHO - denies Cardiac Cath - denies  Sleep Study - denies CPAP - no  Fasting Blood Sugar - na Checks Blood Sugar _____ times a day  Last dose of GLP1 agonist-  na GLP1 instructions:   Blood Thinner Instructions:na Aspirin Instructions:no  ERAS Protcol -no PRE-SURGERY Ensure or G2-   COVID TEST- na   Anesthesia review: no  Patient denies shortness of breath, fever, cough and chest pain at PAT appointment   All instructions explained to the patient, with a verbal understanding of the material. Patient agrees to go over the instructions while at home for a better understanding. Patient also instructed to wear a mask when out in public prior to surgery. The opportunity to ask questions was provided.

## 2023-05-05 ENCOUNTER — Ambulatory Visit (HOSPITAL_COMMUNITY)
Admission: RE | Admit: 2023-05-05 | Discharge: 2023-05-05 | Disposition: A | Discharge status: Home / Self Care | Payer: BC Managed Care – PPO | Attending: Surgery | Admitting: Surgery

## 2023-05-05 ENCOUNTER — Ambulatory Visit (HOSPITAL_COMMUNITY): Payer: BC Managed Care – PPO | Admitting: Certified Registered Nurse Anesthetist

## 2023-05-05 ENCOUNTER — Other Ambulatory Visit: Payer: Self-pay

## 2023-05-05 ENCOUNTER — Encounter (HOSPITAL_COMMUNITY): Payer: Self-pay | Admitting: Surgery

## 2023-05-05 ENCOUNTER — Encounter (HOSPITAL_COMMUNITY)
Admission: RE | Disposition: A | Discharge status: Home / Self Care | Payer: Self-pay | Source: Home / Self Care | Attending: Surgery

## 2023-05-05 DIAGNOSIS — D176 Benign lipomatous neoplasm of spermatic cord: Secondary | ICD-10-CM | POA: Diagnosis not present

## 2023-05-05 DIAGNOSIS — K4090 Unilateral inguinal hernia, without obstruction or gangrene, not specified as recurrent: Secondary | ICD-10-CM | POA: Insufficient documentation

## 2023-05-05 DIAGNOSIS — Z87891 Personal history of nicotine dependence: Secondary | ICD-10-CM | POA: Diagnosis not present

## 2023-05-05 HISTORY — PX: INSERTION OF MESH: SHX5868

## 2023-05-05 HISTORY — PX: INGUINAL HERNIA REPAIR: SHX194

## 2023-05-05 SURGERY — REPAIR, HERNIA, INGUINAL, ADULT
Anesthesia: General | Site: Inguinal | Laterality: Left

## 2023-05-05 MED ORDER — GABAPENTIN 300 MG PO CAPS
300.0000 mg | ORAL_CAPSULE | ORAL | Status: AC
  Administered 2023-05-05: 300 mg via ORAL
  Filled 2023-05-05: qty 1

## 2023-05-05 MED ORDER — 0.9 % SODIUM CHLORIDE (POUR BTL) OPTIME
TOPICAL | Status: DC | PRN
  Administered 2023-05-05: 1000 mL

## 2023-05-05 MED ORDER — DEXAMETHASONE SODIUM PHOSPHATE 10 MG/ML IJ SOLN
INTRAMUSCULAR | Status: DC | PRN
  Administered 2023-05-05: 10 mg via INTRAVENOUS

## 2023-05-05 MED ORDER — CHLORHEXIDINE GLUCONATE 4 % EX SOLN: 60.0000 mL | Freq: Once | CUTANEOUS | Status: DC

## 2023-05-05 MED ORDER — CEFAZOLIN SODIUM-DEXTROSE 2-4 GM/100ML-% IV SOLN
2.0000 g | INTRAVENOUS | Status: AC
  Administered 2023-05-05: 2 g via INTRAVENOUS
  Filled 2023-05-05: qty 100

## 2023-05-05 MED ORDER — BUPIVACAINE LIPOSOME 1.3 % IJ SUSP: 20.0000 mL | Freq: Once | INTRAMUSCULAR | Status: DC

## 2023-05-05 MED ORDER — OXYCODONE HCL 5 MG PO TABS: 5.0000 mg | ORAL_TABLET | Freq: Three times a day (TID) | ORAL | 0 refills | Status: AC | PRN

## 2023-05-05 MED ORDER — ONDANSETRON HCL 4 MG/2ML IJ SOLN
INTRAMUSCULAR | Status: DC | PRN
  Administered 2023-05-05: 4 mg via INTRAVENOUS

## 2023-05-05 MED ORDER — ONDANSETRON HCL 4 MG/2ML IJ SOLN
INTRAMUSCULAR | Status: AC
  Filled 2023-05-05: qty 4

## 2023-05-05 MED ORDER — CHLORHEXIDINE GLUCONATE 0.12 % MT SOLN
15.0000 mL | Freq: Once | OROMUCOSAL | Status: AC
  Administered 2023-05-05: 15 mL via OROMUCOSAL
  Filled 2023-05-05: qty 15

## 2023-05-05 MED ORDER — BUPIVACAINE LIPOSOME 1.3 % IJ SUSP
INTRAMUSCULAR | Status: AC
  Filled 2023-05-05: qty 20

## 2023-05-05 MED ORDER — FENTANYL CITRATE (PF) 250 MCG/5ML IJ SOLN
INTRAMUSCULAR | Status: AC
  Filled 2023-05-05: qty 5

## 2023-05-05 MED ORDER — LIDOCAINE 2% (20 MG/ML) 5 ML SYRINGE
INTRAMUSCULAR | Status: DC | PRN
  Administered 2023-05-05: 40 mg via INTRAVENOUS

## 2023-05-05 MED ORDER — ROCURONIUM BROMIDE 10 MG/ML (PF) SYRINGE
PREFILLED_SYRINGE | INTRAVENOUS | Status: DC | PRN
  Administered 2023-05-05: 60 mg via INTRAVENOUS

## 2023-05-05 MED ORDER — OXYCODONE HCL 5 MG/5ML PO SOLN: 5.0000 mg | Freq: Once | ORAL | Status: DC | PRN

## 2023-05-05 MED ORDER — FENTANYL CITRATE (PF) 100 MCG/2ML IJ SOLN
INTRAMUSCULAR | Status: AC
  Filled 2023-05-05: qty 2

## 2023-05-05 MED ORDER — FENTANYL CITRATE (PF) 250 MCG/5ML IJ SOLN
INTRAMUSCULAR | Status: DC | PRN
  Administered 2023-05-05 (×2): 50 ug via INTRAVENOUS

## 2023-05-05 MED ORDER — PROPOFOL 10 MG/ML IV BOLUS
INTRAVENOUS | Status: AC
  Filled 2023-05-05: qty 20

## 2023-05-05 MED ORDER — ACETAMINOPHEN 325 MG PO TABS: 325.0000 mg | ORAL_TABLET | ORAL | Status: DC | PRN

## 2023-05-05 MED ORDER — PROMETHAZINE HCL 25 MG/ML IJ SOLN: 6.2500 mg | INTRAMUSCULAR | Status: DC | PRN

## 2023-05-05 MED ORDER — FENTANYL CITRATE (PF) 100 MCG/2ML IJ SOLN
25.0000 ug | INTRAMUSCULAR | Status: DC | PRN
  Administered 2023-05-05: 50 ug via INTRAVENOUS

## 2023-05-05 MED ORDER — LACTATED RINGERS IV SOLN: INTRAVENOUS | Status: DC

## 2023-05-05 MED ORDER — MIDAZOLAM HCL 2 MG/2ML IJ SOLN
INTRAMUSCULAR | Status: DC | PRN
  Administered 2023-05-05: 2 mg via INTRAVENOUS

## 2023-05-05 MED ORDER — BUPIVACAINE-EPINEPHRINE 0.25% -1:200000 IJ SOLN
INTRAMUSCULAR | Status: DC | PRN
  Administered 2023-05-05: 45 mL

## 2023-05-05 MED ORDER — ACETAMINOPHEN 325 MG PO TABS: 650.0000 mg | ORAL_TABLET | ORAL | Status: DC | PRN

## 2023-05-05 MED ORDER — SUGAMMADEX SODIUM 200 MG/2ML IV SOLN
INTRAVENOUS | Status: DC | PRN
  Administered 2023-05-05: 200 mg via INTRAVENOUS

## 2023-05-05 MED ORDER — MIDAZOLAM HCL 2 MG/2ML IJ SOLN
INTRAMUSCULAR | Status: AC
  Filled 2023-05-05: qty 2

## 2023-05-05 MED ORDER — ACETAMINOPHEN 160 MG/5ML PO SOLN: 325.0000 mg | ORAL | Status: DC | PRN

## 2023-05-05 MED ORDER — DEXAMETHASONE SODIUM PHOSPHATE 10 MG/ML IJ SOLN
INTRAMUSCULAR | Status: AC
  Filled 2023-05-05: qty 2

## 2023-05-05 MED ORDER — PROPOFOL 10 MG/ML IV BOLUS
INTRAVENOUS | Status: DC | PRN
  Administered 2023-05-05: 200 mg via INTRAVENOUS

## 2023-05-05 MED ORDER — ACETAMINOPHEN 650 MG RE SUPP: 650.0000 mg | RECTAL | Status: DC | PRN

## 2023-05-05 MED ORDER — ACETAMINOPHEN 500 MG PO TABS
1000.0000 mg | ORAL_TABLET | ORAL | Status: AC
  Administered 2023-05-05: 1000 mg via ORAL
  Filled 2023-05-05: qty 2

## 2023-05-05 MED ORDER — BUPIVACAINE-EPINEPHRINE (PF) 0.25% -1:200000 IJ SOLN
INTRAMUSCULAR | Status: AC
  Filled 2023-05-05: qty 30

## 2023-05-05 MED ORDER — AMISULPRIDE (ANTIEMETIC) 5 MG/2ML IV SOLN: 10.0000 mg | Freq: Once | INTRAVENOUS | Status: DC | PRN

## 2023-05-05 MED ORDER — ACETAMINOPHEN 10 MG/ML IV SOLN: 1000.0000 mg | Freq: Once | INTRAVENOUS | Status: DC | PRN

## 2023-05-05 MED ORDER — DOCUSATE SODIUM 100 MG PO CAPS: 100.0000 mg | ORAL_CAPSULE | Freq: Two times a day (BID) | ORAL | 0 refills | Status: AC

## 2023-05-05 MED ORDER — OXYCODONE HCL 5 MG PO TABS: 5.0000 mg | ORAL_TABLET | Freq: Once | ORAL | Status: DC | PRN

## 2023-05-05 MED ORDER — OXYCODONE HCL 5 MG PO TABS: 5.0000 mg | ORAL_TABLET | ORAL | Status: DC | PRN

## 2023-05-05 MED ORDER — ORAL CARE MOUTH RINSE: 15.0000 mL | Freq: Once | OROMUCOSAL | Status: AC

## 2023-05-05 MED ORDER — LIDOCAINE 2% (20 MG/ML) 5 ML SYRINGE
INTRAMUSCULAR | Status: AC
  Filled 2023-05-05: qty 10

## 2023-05-05 MED ORDER — FENTANYL CITRATE PF 50 MCG/ML IJ SOSY: 25.0000 ug | PREFILLED_SYRINGE | INTRAMUSCULAR | Status: DC | PRN

## 2023-05-05 MED ORDER — ROCURONIUM BROMIDE 10 MG/ML (PF) SYRINGE
PREFILLED_SYRINGE | INTRAVENOUS | Status: AC
  Filled 2023-05-05: qty 20

## 2023-05-05 SURGICAL SUPPLY — 42 items
APL PRP STRL LF DISP 70% ISPRP (MISCELLANEOUS) ×1
APL SKNCLS STERI-STRIP NONHPOA (GAUZE/BANDAGES/DRESSINGS) ×1
BENZOIN TINCTURE PRP APPL 2/3 (GAUZE/BANDAGES/DRESSINGS) ×2 IMPLANT
BLADE CLIPPER SURG (BLADE) IMPLANT
CANISTER SUCT 3000ML PPV (MISCELLANEOUS) IMPLANT
CHLORAPREP W/TINT 26 (MISCELLANEOUS) ×2 IMPLANT
CLSR STERI-STRIP ANTIMIC 1/2X4 (GAUZE/BANDAGES/DRESSINGS) IMPLANT
COVER SURGICAL LIGHT HANDLE (MISCELLANEOUS) ×2 IMPLANT
DRAIN PENROSE .5X12 LATEX STL (DRAIN) IMPLANT
DRAPE LAPAROTOMY TRNSV 102X78 (DRAPES) IMPLANT
DRSG TEGADERM 4X4.75 (GAUZE/BANDAGES/DRESSINGS) ×2 IMPLANT
ELECT REM PT RETURN 9FT ADLT (ELECTROSURGICAL) ×1
ELECTRODE REM PT RTRN 9FT ADLT (ELECTROSURGICAL) ×2 IMPLANT
GAUZE 4X4 16PLY ~~LOC~~+RFID DBL (SPONGE) ×2 IMPLANT
GAUZE SPONGE 4X4 12PLY STRL (GAUZE/BANDAGES/DRESSINGS) ×2 IMPLANT
GLOVE BIO SURGEON STRL SZ 6 (GLOVE) ×2 IMPLANT
GLOVE INDICATOR 6.5 STRL GRN (GLOVE) ×2 IMPLANT
GOWN STRL REUS W/ TWL LRG LVL3 (GOWN DISPOSABLE) ×4 IMPLANT
GOWN STRL REUS W/TWL LRG LVL3 (GOWN DISPOSABLE) ×2
KIT BASIN OR (CUSTOM PROCEDURE TRAY) ×2 IMPLANT
KIT TURNOVER KIT B (KITS) ×2 IMPLANT
MESH ULTRAPRO 3X6 7.6X15CM (Mesh General) IMPLANT
NDL HYPO 25GX1X1/2 BEV (NEEDLE) ×2 IMPLANT
NEEDLE HYPO 25GX1X1/2 BEV (NEEDLE) ×1
NS IRRIG 1000ML POUR BTL (IV SOLUTION) ×2 IMPLANT
PACK GENERAL/GYN (CUSTOM PROCEDURE TRAY) ×2 IMPLANT
PAD ARMBOARD 7.5X6 YLW CONV (MISCELLANEOUS) ×2 IMPLANT
STRIP CLOSURE SKIN 1/2X4 (GAUZE/BANDAGES/DRESSINGS) ×2 IMPLANT
SUT ETHIBOND 0 MO6 C/R (SUTURE) ×2 IMPLANT
SUT MNCRL AB 4-0 PS2 18 (SUTURE) ×2 IMPLANT
SUT PDS AB 0 CT1 27 (SUTURE) ×2 IMPLANT
SUT PDS AB 2-0 CT2 27 (SUTURE) IMPLANT
SUT SILK 3 0 (SUTURE)
SUT SILK 3-0 18XBRD TIE 12 (SUTURE) IMPLANT
SUT VIC AB 0 CT2 27 (SUTURE) ×2 IMPLANT
SUT VIC AB 2-0 SH 27 (SUTURE) ×1
SUT VIC AB 2-0 SH 27X BRD (SUTURE) ×2 IMPLANT
SUT VIC AB 3-0 SH 27 (SUTURE) ×1
SUT VIC AB 3-0 SH 27XBRD (SUTURE) ×2 IMPLANT
SUT VICRYL AB 3 0 TIES (SUTURE) ×2 IMPLANT
SYR CONTROL 10ML LL (SYRINGE) ×2 IMPLANT
TOWEL GREEN STERILE (TOWEL DISPOSABLE) ×2 IMPLANT

## 2023-05-05 NOTE — Interval H&P Note (Signed)
History and Physical Interval Note:  05/05/2023 6:52 AM  Gregory Hardy  has presented today for surgery, with the diagnosis of INGUINAL HERNIA.  The various methods of treatment have been discussed with the patient and family. After consideration of risks, benefits and other options for treatment, the patient has consented to  Procedure(s): OPEN LEFT INGUINAL HERNIA REPAIR WITH MESH (Left) as a surgical intervention.  The patient's history has been reviewed, patient examined, no change in status, stable for surgery.  I have reviewed the patient's chart and labs.  Questions were answered to the patient's satisfaction.     Gregory Hardy

## 2023-05-05 NOTE — Transfer of Care (Signed)
Immediate Anesthesia Transfer of Care Note  Patient: Gregory Hardy  Procedure(s) Performed: OPEN LEFT INGUINAL HERNIA REPAIR (Left: Inguinal) INSERTION OF MESH (Left: Inguinal)  Patient Location: PACU  Anesthesia Type:General  Level of Consciousness: awake, alert , and oriented  Airway & Oxygen Therapy: Patient Spontanous Breathing  Post-op Assessment: Report given to RN and Post -op Vital signs reviewed and stable  Post vital signs: Reviewed and stable  Last Vitals:  Vitals Value Taken Time  BP 124/67 05/05/23 0854  Temp    Pulse 67 05/05/23 0857  Resp 12 05/05/23 0857  SpO2 98 % 05/05/23 0857  Vitals shown include unfiled device data.  Last Pain:  Vitals:   05/05/23 0644  TempSrc:   PainSc: 0-No pain      Patients Stated Pain Goal: 0 (05/05/23 0644)  Complications: No notable events documented.

## 2023-05-05 NOTE — Discharge Instructions (Signed)
HERNIA REPAIR: POST OP INSTRUCTIONS   EAT Gradually transition to a high fiber diet with a fiber supplement over the next few weeks after discharge.  Start with a pureed / full liquid diet (see below)  WALK Walk an hour a day (cumulative- not all at once).  Control your pain to do that.    CONTROL PAIN Control pain so that you can walk, sleep, tolerate sneezing/coughing, and go up/down stairs.  HAVE A BOWEL MOVEMENT DAILY Keep your bowels regular to avoid problems.  OK to try a laxative to override constipation.  OK to use an antidiarrheal to slow down diarrhea.  Call if not better after 2 tries  CALL IF YOU HAVE PROBLEMS/CONCERNS Call if you are still struggling despite following these instructions. Call if you have concerns not answered by these instructions  ######################################################################    DIET: Follow a light bland diet & liquids the first 24 hours after arrival home, such as soup, liquids, starches, etc.  Be sure to drink plenty of fluids.  Quickly advance to a usual solid diet within a few days.  Avoid fast food or heavy meals initially as you are more likely to get nauseated or have irregular bowels.  Take your usually prescribed home medications unless otherwise directed.  PAIN CONTROL: Pain is best controlled by a usual combination of three different methods TOGETHER: Ice/Heat Over the counter pain medication Prescription pain medication Most patients will experience some swelling and bruising around the hernia(s) such as the bellybutton, groins, or old incisions.  Ice packs or heating pads (30-60 minutes up to 6 times a day) will help. Use ice for the first few days to help decrease swelling and bruising, then switch to heat to help relax tight/sore spots and speed recovery.  Some people prefer to use ice alone, heat alone, alternating between ice & heat.  Experiment to what works for you.  Swelling and bruising can take several  weeks to resolve.   It is helpful to take an over-the-counter pain medication regularly for the first days: Naproxen (Aleve, etc)  Two 220mg  tabs twice a day OR Ibuprofen (Advil, etc) Three 200mg  tabs four times a day (every meal & bedtime) AND Acetaminophen (Tylenol, etc) 325-650mg  four times a day (every meal & bedtime) A  prescription for pain medication should be given to you upon discharge.  Take your pain medication as prescribed, IF NEEDED.  If you are having problems/concerns with the prescription medicine (does not control pain, nausea, vomiting, rash, itching, etc), please call us [phone] to see if we need to switch you to a different pain medicine that will work better for you and/or control your side effect better. If you need a refill on your pain medication, please contact your pharmacy.  They will contact our office to request authorization. Prescriptions will not be filled after 5 pm or on week-ends.  Avoid getting constipated.  Between the surgery and the pain medications, it is common to experience some constipation.  Increasing fluid intake and taking a fiber supplement (such as Metamucil, Citrucel, FiberCon, MiraLax, etc) 1-2 times a day regularly will usually help prevent this problem from occurring.  A mild laxative (prune juice, Milk of Magnesia, MiraLax, etc) should be taken according to package directions if there are no bowel movements after 48 hours.    Wash / shower every day, starting 2 days after surgery.  You may shower over the steri strips or skin glue which are waterproof.  No rubbing, scrubbing, lotions  or ointments to incision(s). Do not soak or submerge.   Remove your outer bandage 2 days after surgery. Steri strips (if present) will peel off after 1-2 weeks. Glue (if present) will flake off after about 2 weeks.  You may leave the incision open to air.  You may replace a dressing/Band-Aid to cover an incision for comfort if you wish.  Continue to shower over  incision(s) after the dressing is off.  ACTIVITIES as tolerated:   You may resume regular (light) daily activities beginning the next day--such as daily self-care, walking, climbing stairs--gradually increasing activities as tolerated.  Control your pain so that you can walk an hour a day.  If you can walk 30 minutes without difficulty, it is safe to try more intense activity such as jogging, treadmill, bicycling, low-impact aerobics, swimming, etc. Refrain from the most intensive and strenuous activity such as sit-ups, heavy lifting, contact sports, etc  Refrain from any heavy lifting or straining until 6 weeks after surgery.   DO NOT PUSH THROUGH PAIN.  Let pain be your guide: If it hurts to do something, don't do it.  Pain is your body warning you to avoid that activity for another week until the pain goes down. You may drive when you are no longer taking prescription pain medication, you can comfortably wear a seatbelt, and you can safely maneuver your car and apply brakes. You may have sexual intercourse when it is comfortable.   FOLLOW UP in our office Please call [facility] at [phone] to set up an appointment to see your surgeon in the office for a follow-up appointment approximately 2-3 weeks after your surgery. Make sure that you call for this appointment the day you arrive home to insure a convenient appointment time.  9.  If you have disability of FMLA / Family leave forms, please bring the forms to the office for processing.  (do not give to your surgeon).  WHEN TO CALL us [phone]: Poor pain control Reactions / problems with new medications (rash/itching, nausea, etc)  Fever over 101.5 F (38.5 C) Inability to urinate Nausea and/or vomiting Worsening swelling or bruising Continued bleeding from incision. Increased pain, redness, or drainage from the incision   The clinic staff is available to answer your questions during regular business hours (8:30am-5pm).  Please don't  hesitate to call and ask to speak to one of our nurses for clinical concerns.   If you have a medical emergency, go to the nearest emergency room or call 911.  A surgeon from [facility] is always on call at the hospitals in [facility], [place] [address], [place], [place]  [address] ?  [address] [address], [place], [place]   [address] MAIN: [phone] ? TOLL FREE: [phone] ? FAX: [phone] [email or web address]

## 2023-05-05 NOTE — Op Note (Signed)
Operative Note  Gregory Hardy  [id number]  [id number]  05/05/2023   Surgeon: Berna Bue MD FACS   Procedure performed: Open left inguinal hernia repair with mesh   Preop diagnosis:  left inguinal hernia   Post-op diagnosis/intraop findings: indirect inguinal hernia   Specimens: none   EBL: 5cc   Complications: none   Description of procedure: After confirming informed consent, the patient was taken to the operating room and placed supine on operating room table where general anesthesia was initiated, preoperative antibiotics were administered, SCDs applied, and a formal timeout was performed. The groin was clipped, prepped and draped in the usual sterile fashion. An oblique incision was made the just above the inguinal ligament after infiltrating the tissues with local anesthetic (Exparel mixed with quarter percent Marcaine with epinephrine). Soft tissues were dissected using electrocautery until the external oblique aponeurosis was encountered. This was divided sharply to expand the external ring. A plane was bluntly developed between the spermatic cord and the external oblique. The ilioinguinal nerve was divided sharply between hemostats and ligated with 3-0 Vicryl ties.. The spermatic cord was then bluntly dissected away from the pubic tubercle and encircled with a Penrose. Inspection of the inguinal anatomy revealed a small indirect hernia sac and a small cord lipoma. The indirect hernia sac was bluntly dissected away from the cord structures and skeletonized to the level of the internal ring, where it was reduced intact into the abdomen.  The small cord lipoma was excised with cautery.  There was laxity but no overt hernia in the direct space and some dilation of the internal ring; the inguinal floor was reconstructed with interrupted 0 PDS, leaving an internal ring just sufficient for the cord structures. A 3 x 6 piece of ultra Pro mesh was brought onto the field and trimmed to  approximate the field. This was sutured to the pubic tubercle fascia, inferior shelving edge and to the internal oblique superiorly with interrupted 0 ethibonds. The tails of the mesh were wrapped around the spermatic cord, ensuring adequate room for the cord, and sutured to each other with 0 ethibond, and then directed laterally to lie flat beneath the external oblique aponeurosis. Hemostasis was ensured within the wound. The Penrose was removed. The external oblique aponeurosis was reapproximated with a running 3-0 Vicryl to re-create a narrowed external ring. More local was infiltrated around the pubic tubercle and in the plane just below the external oblique. The Scarpa's was reapproximated with interrupted 2-0 Vicryls. The skin was closed with a running subcuticular 4-0 Monocryl. The remainder of the local was injected in the subcutaneous and subcuticular space. The field was then cleaned, benzoin and Steri-Strips and sterile bandage were applied. The patient was then awakened extubated and taken to PACU in stable condition.    All counts were correct at the completion of the case

## 2023-05-05 NOTE — Anesthesia Preprocedure Evaluation (Addendum)
Anesthesia Evaluation  Patient identified by MRN, date of birth, ID band Patient awake    Reviewed: Allergy & Precautions, NPO status , Patient's Chart, lab work & pertinent test results  Airway Mallampati: II  TM Distance: >3 FB Neck ROM: Full    Dental  (+) Teeth Intact, Dental Advisory Given   Pulmonary Current Smoker and Patient abstained from smoking.   breath sounds clear to auscultation       Cardiovascular negative cardio ROS  Rhythm:Regular Rate:Normal     Neuro/Psych  PSYCHIATRIC DISORDERS Anxiety  Bipolar Disorder      GI/Hepatic negative GI ROS, Neg liver ROS,,,  Endo/Other  Hypothyroidism    Renal/GU negative Renal ROS     Musculoskeletal negative musculoskeletal ROS (+)    Abdominal   Peds  Hematology negative hematology ROS (+)   Anesthesia Other Findings   Reproductive/Obstetrics                             Anesthesia Physical Anesthesia Plan  ASA: 2  Anesthesia Plan: General   Post-op Pain Management: Tylenol PO (pre-op)* and Gabapentin PO (pre-op)*   Induction: Intravenous  PONV Risk Score and Plan: 2 and Ondansetron, Dexamethasone and Midazolam  Airway Management Planned: Oral ETT  Additional Equipment: None  Intra-op Plan:   Post-operative Plan: Extubation in OR  Informed Consent: I have reviewed the patients History and Physical, chart, labs and discussed the procedure including the risks, benefits and alternatives for the proposed anesthesia with the patient or authorized representative who has indicated his/her understanding and acceptance.     Dental advisory given  Plan Discussed with: CRNA  Anesthesia Plan Comments:        Anesthesia Quick Evaluation

## 2023-05-05 NOTE — Anesthesia Procedure Notes (Signed)
Procedure Name: Intubation Date/Time: 05/05/2023 7:37 AM  Performed by: Garfield Cornea, CRNAPre-anesthesia Checklist: Patient identified, Emergency Drugs available, Suction available and Patient being monitored Patient Re-evaluated:Patient Re-evaluated prior to induction Oxygen Delivery Method: Circle System Utilized Preoxygenation: Pre-oxygenation with 100% oxygen Induction Type: IV induction Ventilation: Mask ventilation without difficulty and Oral airway inserted - appropriate to patient size Laryngoscope Size: Mac and 4 Grade View: Grade II Tube type: Oral Tube size: 7.5 mm Number of attempts: 1 Airway Equipment and Method: Stylet and Oral airway Placement Confirmation: ETT inserted through vocal cords under direct vision, positive ETCO2 and breath sounds checked- equal and bilateral Secured at: 23 cm Tube secured with: Tape Dental Injury: Teeth and Oropharynx as per pre-operative assessment

## 2023-05-05 NOTE — Anesthesia Postprocedure Evaluation (Signed)
Anesthesia Post Note  Patient: Gregory Hardy  Procedure(s) Performed: OPEN LEFT INGUINAL HERNIA REPAIR (Left: Inguinal) INSERTION OF MESH (Left: Inguinal)     Patient location during evaluation: PACU Anesthesia Type: General Level of consciousness: awake and alert Pain management: pain level controlled Vital Signs Assessment: post-procedure vital signs reviewed and stable Respiratory status: spontaneous breathing, nonlabored ventilation, respiratory function stable and patient connected to nasal cannula oxygen Cardiovascular status: blood pressure returned to baseline and stable Postop Assessment: no apparent nausea or vomiting Anesthetic complications: no  No notable events documented.  Last Vitals:  Vitals:   05/05/23 0910 05/05/23 0925  BP: 124/66 127/76  Pulse: 68 (!) 54  Resp: 15 10  Temp:  36.4 C  SpO2: 98% 96%    Last Pain:  Vitals:   05/05/23 0925  TempSrc:   PainSc: 3                  Shelton Silvas

## 2023-05-06 ENCOUNTER — Encounter (HOSPITAL_COMMUNITY): Payer: Self-pay | Admitting: Surgery

## 2023-05-19 DIAGNOSIS — M9902 Segmental and somatic dysfunction of thoracic region: Secondary | ICD-10-CM | POA: Diagnosis not present

## 2023-05-19 DIAGNOSIS — M9905 Segmental and somatic dysfunction of pelvic region: Secondary | ICD-10-CM | POA: Diagnosis not present

## 2023-05-19 DIAGNOSIS — M791 Myalgia, unspecified site: Secondary | ICD-10-CM | POA: Diagnosis not present

## 2023-05-19 DIAGNOSIS — M9903 Segmental and somatic dysfunction of lumbar region: Secondary | ICD-10-CM | POA: Diagnosis not present

## 2023-05-28 DIAGNOSIS — M9903 Segmental and somatic dysfunction of lumbar region: Secondary | ICD-10-CM | POA: Diagnosis not present

## 2023-05-28 DIAGNOSIS — M9906 Segmental and somatic dysfunction of lower extremity: Secondary | ICD-10-CM | POA: Diagnosis not present

## 2023-05-28 DIAGNOSIS — M9905 Segmental and somatic dysfunction of pelvic region: Secondary | ICD-10-CM | POA: Diagnosis not present

## 2023-05-28 DIAGNOSIS — M9902 Segmental and somatic dysfunction of thoracic region: Secondary | ICD-10-CM | POA: Diagnosis not present

## 2023-06-04 DIAGNOSIS — M9903 Segmental and somatic dysfunction of lumbar region: Secondary | ICD-10-CM | POA: Diagnosis not present

## 2023-06-04 DIAGNOSIS — M9902 Segmental and somatic dysfunction of thoracic region: Secondary | ICD-10-CM | POA: Diagnosis not present

## 2023-06-04 DIAGNOSIS — M9905 Segmental and somatic dysfunction of pelvic region: Secondary | ICD-10-CM | POA: Diagnosis not present

## 2023-06-04 DIAGNOSIS — M9906 Segmental and somatic dysfunction of lower extremity: Secondary | ICD-10-CM | POA: Diagnosis not present

## 2023-06-11 DIAGNOSIS — M9905 Segmental and somatic dysfunction of pelvic region: Secondary | ICD-10-CM | POA: Diagnosis not present

## 2023-06-11 DIAGNOSIS — M9902 Segmental and somatic dysfunction of thoracic region: Secondary | ICD-10-CM | POA: Diagnosis not present

## 2023-06-11 DIAGNOSIS — M9903 Segmental and somatic dysfunction of lumbar region: Secondary | ICD-10-CM | POA: Diagnosis not present

## 2023-06-11 DIAGNOSIS — M9906 Segmental and somatic dysfunction of lower extremity: Secondary | ICD-10-CM | POA: Diagnosis not present

## 2023-06-18 DIAGNOSIS — M9902 Segmental and somatic dysfunction of thoracic region: Secondary | ICD-10-CM | POA: Diagnosis not present

## 2023-06-18 DIAGNOSIS — M9905 Segmental and somatic dysfunction of pelvic region: Secondary | ICD-10-CM | POA: Diagnosis not present

## 2023-06-18 DIAGNOSIS — M9903 Segmental and somatic dysfunction of lumbar region: Secondary | ICD-10-CM | POA: Diagnosis not present

## 2023-06-18 DIAGNOSIS — M9906 Segmental and somatic dysfunction of lower extremity: Secondary | ICD-10-CM | POA: Diagnosis not present

## 2023-07-09 DIAGNOSIS — M9903 Segmental and somatic dysfunction of lumbar region: Secondary | ICD-10-CM | POA: Diagnosis not present

## 2023-07-09 DIAGNOSIS — M9905 Segmental and somatic dysfunction of pelvic region: Secondary | ICD-10-CM | POA: Diagnosis not present

## 2023-07-09 DIAGNOSIS — M9906 Segmental and somatic dysfunction of lower extremity: Secondary | ICD-10-CM | POA: Diagnosis not present

## 2023-07-09 DIAGNOSIS — M9902 Segmental and somatic dysfunction of thoracic region: Secondary | ICD-10-CM | POA: Diagnosis not present

## 2023-07-16 DIAGNOSIS — M9903 Segmental and somatic dysfunction of lumbar region: Secondary | ICD-10-CM | POA: Diagnosis not present

## 2023-07-16 DIAGNOSIS — M9905 Segmental and somatic dysfunction of pelvic region: Secondary | ICD-10-CM | POA: Diagnosis not present

## 2023-07-16 DIAGNOSIS — M9906 Segmental and somatic dysfunction of lower extremity: Secondary | ICD-10-CM | POA: Diagnosis not present

## 2023-07-16 DIAGNOSIS — M9902 Segmental and somatic dysfunction of thoracic region: Secondary | ICD-10-CM | POA: Diagnosis not present

## 2023-07-17 NOTE — Plan of Care (Signed)
[facility] Tonsillectomy/Adenoidectomy, Postoperative PEDS care plan entered in error.

## 2023-08-06 DIAGNOSIS — M9906 Segmental and somatic dysfunction of lower extremity: Secondary | ICD-10-CM | POA: Diagnosis not present

## 2023-08-06 DIAGNOSIS — M9902 Segmental and somatic dysfunction of thoracic region: Secondary | ICD-10-CM | POA: Diagnosis not present

## 2023-08-06 DIAGNOSIS — M9905 Segmental and somatic dysfunction of pelvic region: Secondary | ICD-10-CM | POA: Diagnosis not present

## 2023-08-06 DIAGNOSIS — M9903 Segmental and somatic dysfunction of lumbar region: Secondary | ICD-10-CM | POA: Diagnosis not present

## 2023-08-08 DIAGNOSIS — Z8249 Family history of ischemic heart disease and other diseases of the circulatory system: Secondary | ICD-10-CM | POA: Diagnosis not present

## 2023-08-08 DIAGNOSIS — E559 Vitamin D deficiency, unspecified: Secondary | ICD-10-CM | POA: Diagnosis not present

## 2023-08-08 DIAGNOSIS — Z0001 Encounter for general adult medical examination with abnormal findings: Secondary | ICD-10-CM | POA: Diagnosis not present

## 2023-08-08 DIAGNOSIS — E063 Autoimmune thyroiditis: Secondary | ICD-10-CM | POA: Diagnosis not present

## 2023-08-13 DIAGNOSIS — M9906 Segmental and somatic dysfunction of lower extremity: Secondary | ICD-10-CM | POA: Diagnosis not present

## 2023-08-13 DIAGNOSIS — M9903 Segmental and somatic dysfunction of lumbar region: Secondary | ICD-10-CM | POA: Diagnosis not present

## 2023-08-13 DIAGNOSIS — M9905 Segmental and somatic dysfunction of pelvic region: Secondary | ICD-10-CM | POA: Diagnosis not present

## 2023-08-13 DIAGNOSIS — M9902 Segmental and somatic dysfunction of thoracic region: Secondary | ICD-10-CM | POA: Diagnosis not present

## 2023-08-19 ENCOUNTER — Ambulatory Visit: Payer: BC Managed Care – PPO | Admitting: Psychiatry

## 2023-08-20 DIAGNOSIS — M9906 Segmental and somatic dysfunction of lower extremity: Secondary | ICD-10-CM | POA: Diagnosis not present

## 2023-08-20 DIAGNOSIS — M9905 Segmental and somatic dysfunction of pelvic region: Secondary | ICD-10-CM | POA: Diagnosis not present

## 2023-08-20 DIAGNOSIS — M9902 Segmental and somatic dysfunction of thoracic region: Secondary | ICD-10-CM | POA: Diagnosis not present

## 2023-08-20 DIAGNOSIS — M9903 Segmental and somatic dysfunction of lumbar region: Secondary | ICD-10-CM | POA: Diagnosis not present

## 2023-08-26 ENCOUNTER — Other Ambulatory Visit: Payer: Self-pay | Admitting: *Deleted

## 2023-08-26 DIAGNOSIS — E063 Autoimmune thyroiditis: Secondary | ICD-10-CM

## 2023-08-28 ENCOUNTER — Ambulatory Visit
Admission: RE | Admit: 2023-08-28 | Discharge: 2023-08-28 | Disposition: A | Discharge status: Home / Self Care | Payer: BC Managed Care – PPO | Source: Ambulatory Visit | Attending: *Deleted | Admitting: *Deleted

## 2023-08-28 ENCOUNTER — Other Ambulatory Visit: Payer: BC Managed Care – PPO

## 2023-08-28 DIAGNOSIS — E063 Autoimmune thyroiditis: Secondary | ICD-10-CM | POA: Diagnosis not present

## 2023-09-02 ENCOUNTER — Other Ambulatory Visit: Payer: Self-pay | Admitting: Psychiatry

## 2023-09-02 DIAGNOSIS — F3176 Bipolar disorder, in full remission, most recent episode depressed: Secondary | ICD-10-CM

## 2023-09-02 DIAGNOSIS — Z79899 Other long term (current) drug therapy: Secondary | ICD-10-CM

## 2023-10-23 ENCOUNTER — Encounter: Payer: Self-pay | Admitting: Psychiatry

## 2023-10-23 ENCOUNTER — Ambulatory Visit (INDEPENDENT_AMBULATORY_CARE_PROVIDER_SITE_OTHER): Payer: BC Managed Care – PPO | Admitting: Psychiatry

## 2023-10-23 DIAGNOSIS — F3176 Bipolar disorder, in full remission, most recent episode depressed: Secondary | ICD-10-CM

## 2023-10-23 DIAGNOSIS — F1021 Alcohol dependence, in remission: Secondary | ICD-10-CM

## 2023-10-23 DIAGNOSIS — Z79899 Other long term (current) drug therapy: Secondary | ICD-10-CM | POA: Diagnosis not present

## 2023-10-23 NOTE — Progress Notes (Signed)
PAUL TRETTIN [id number] 25-Sep-1970 53 y.o.  Subjective:   Patient ID:  MANSA WILLERS is a 53 y.o. (DOB Nov 18, 1970) male.  Chief Complaint:  Chief Complaint  Patient presents with   Follow-up   Other    Family issue concerns    HPI LILTON PARE presents to the office today for follow-up of bipolar disorder.   He was last seen 06/2019.  No meds were changed.    06/27/2020 appt with the following noted. I've been good.  Things been pretty stable.  Still in recovery, 5 years next month.  Goes to a couple of meetings a week.  He feels his brain has recovered from the years of alcohol abuse. Focuses a lot on exercise and nutrition.  Gym daily for a couple of years. 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 Less anger.  Done ok with less lithium for months..  Uses CBT coping skills.  No mood swings of significance.  No sugar and only water. Good PE with labs. Plan: continue lithum 900  06/27/2021 appt needed: 5 kids. Doing well.  I feel fantastic.  Exercise mandatory and it has dramatically helped mood and health.  Sober still.  Making good money.  Worked hard.  Still playing golf.  No sig conflicts.   Patient reports stable mood and denies depressed or irritable moods.  Patient denies any recent difficulty with anxiety.  Patient denies difficulty with sleep initiation or maintenance. Denies appetite disturbance.  Patient reports that energy and motivation have been good.  Patient denies any difficulty with concentration.  Patient denies any suicidal ideation. No SE. Consistent with meds.  08/13/22 appt noted: Still good stability.  No mood swings, mania or depression. Tolerating meds. Continues lithium 900 mg daily.  No SE Doesn't bother watching movies much now. Still exercise. 7 year sober with chip.  Wife supportive.   5 kids.  From 1 to 89 yo. Going to gym. 19 yo dx ODD and dealing with that.  10/23/23 appt noted: Still doing well.  Mental and physical  disciplines help.  Good business.  Good family.  Stay busy.  Golf.  Gym 2-3 times daily.  Has not paid as much attention to family as he should lately.   Good mood stability.  No swings, mania.   Sober since here.   Tolerating meds. Continues lithium 900 mg daily.  No SE Started thyroid med  Before last relapse  He had 10 years sobriety, differently this time better exercise and diet.  Lives in [place].  Doesn't feel the effects from the lithium but when out for 2 days and he felt more angry and frustrated.   Questions about smoking cessation.   Review of Systems:  Review of Systems  Neurological:  Negative for tremors and weakness.  Psychiatric/Behavioral:  Negative for agitation, behavioral problems, confusion, decreased concentration, dysphoric mood, hallucinations, self-injury, sleep disturbance and suicidal ideas. The patient is not nervous/anxious and is not hyperactive.     Medications: I have reviewed the patient's current medications.  Current Outpatient Medications  Medication Sig Dispense Refill   Cyanocobalamin (B-12 PO) Take 1 capsule by mouth daily.     Glucosamine HCl (GLUCOSAMINE PO) Take 1 tablet by mouth 2 (two) times daily.     KRILL OIL PO Take 1 capsule by mouth 2 (two) times daily.     levothyroxine (SYNTHROID) 50 MCG tablet Take 50 mcg by mouth daily before breakfast.     lithium carbonate (ESKALITH) 450 MG ER tablet  TAKE 3 TABLETS BY MOUTH EVERY DAY 270 tablet 0   Multiple Vitamin (MULTIVITAMIN) tablet Take 1 tablet by mouth daily.     Saw Palmetto, Serenoa repens, (SAW PALMETTO PO) Take 2 capsules by mouth 2 (two) times daily.     tadalafil (CIALIS) 5 MG tablet Take 5 mg by mouth daily.     tamsulosin (FLOMAX) 0.4 MG CAPS capsule Take 0.4 mg by mouth 2 (two) times daily.  11   TURMERIC PO Take 2 tablets by mouth 2 (two) times daily.     No current facility-administered medications for this visit.    Medication Side Effects: None  Allergies: No  Known Allergies  Past Medical History:  Diagnosis Date   Anxiety    Bipolar 1 disorder (HCC)    Hypothyroidism    Opiate addiction (HCC)     History reviewed. No pertinent family history.  Social History   Socioeconomic History   Marital status: Married    Spouse name: Not on file   Number of children: Not on file   Years of education: Not on file   Highest education level: Not on file  Occupational History   Not on file  Tobacco Use   Smoking status: Every Day    Current packs/day: 0.50    Types: E-cigarettes, Cigarettes   Smokeless tobacco: Never  Substance and Sexual Activity   Alcohol use: Not Currently    Comment: At least 6 drinks daily. Last drink: 22:30 last night.    Drug use: Not Currently    Types: Marijuana    Comment: History of heroin. Last used: "a few days". "Maybe Thursday or Friday of last week"   Sexual activity: Not on file  Other Topics Concern   Not on file  Social History Narrative   Not on file   Social Drivers of Health   Financial Resource Strain: Not on file  Food Insecurity: Not on file  Transportation Needs: Not on file  Physical Activity: Not on file  Stress: Not on file  Social Connections: Not on file  Intimate Partner Violence: Not on file    Past Medical History, Surgical history, Social history, and Family history were reviewed and updated as appropriate.   Please see review of systems for further details on the patient's review from today.   Objective:   Physical Exam:  There were no vitals taken for this visit.  Physical Exam Constitutional:      General: He is not in acute distress.    Appearance: He is well-developed.  Musculoskeletal:        General: No deformity.  Neurological:     Mental Status: He is alert and oriented to person, place, and time.     Motor: No tremor.     Coordination: Coordination normal.     Gait: Gait normal.  Psychiatric:        Attention and Perception: He is attentive. He does not  perceive auditory hallucinations.        Mood and Affect: Mood is not anxious or depressed. Affect is not labile, blunt, angry or inappropriate.        Speech: Speech normal. Speech is not rapid and pressured.        Behavior: Behavior normal. Behavior is not agitated.        Thought Content: Thought content normal. Thought content does not include homicidal or suicidal ideation. Thought content does not include homicidal or suicidal plan.        Cognition  and Memory: Cognition normal.        Judgment: Judgment normal.     Comments: Insight intact. No auditory or visual hallucinations. No delusions.  No mania. Normal emotional range     Lab Review:     Component Value Date/Time   NA 137 04/30/2023 0929   K 4.7 04/30/2023 0929   CL 103 04/30/2023 0929   CO2 26 04/30/2023 0929   GLUCOSE 96 04/30/2023 0929   BUN 21 (H) 04/30/2023 0929   CREATININE 1.20 04/30/2023 0929   CALCIUM 9.2 04/30/2023 0929   PROT 7.0 04/30/2023 0929   ALBUMIN 3.9 04/30/2023 0929   AST 26 04/30/2023 0929   ALT 32 04/30/2023 0929   ALKPHOS 64 04/30/2023 0929   BILITOT 0.9 04/30/2023 0929   GFRNONAA >60 04/30/2023 0929   GFRAA >60 08/07/2015 0339       Component Value Date/Time   WBC 6.5 04/30/2023 0929   RBC 5.22 04/30/2023 0929   HGB 16.2 04/30/2023 0929   HCT 49.0 04/30/2023 0929   PLT 219 04/30/2023 0929   MCV 93.9 04/30/2023 0929   MCH 31.0 04/30/2023 0929   MCHC 33.1 04/30/2023 0929   RDW 13.1 04/30/2023 0929   LYMPHSABS 2.8 08/07/2015 0339   MONOABS 1.1 (H) 08/07/2015 0339   EOSABS 0.2 08/07/2015 0339   BASOSABS 0.1 08/07/2015 0339    Lithium Lvl  Date Value Ref Range Status  07/03/2021 0.4 (L) 0.6 - 1.2 mmol/L Final   07/03/21 lithium 0.4 at 900 mg HS with normal testosterone and TSH  No results found for: "PHENYTOIN", "PHENOBARB", "VALPROATE", "CBMZ"   lithium 03/09/18 = 0.4 at 900 mg daily. Normal BMP and TSH.  .res Assessment: Plan:    Bipolar 1 disorder, depressed, full  remission (HCC) - Plan: Lithium level, Lithium level  Alcohol dependence in remission (HCC)  Lithium use - Plan: Lithium level   Meds stable and mood and mood without problems.  Stable on lithium.    he has been very stable for a number of years.   Repeat lithium level ASAP Continue lithium CR 1350 HS until lithium level received.  Again Counseled patient regarding potential benefits, risks, and side effects of lithium to include potential risk of lithium affecting thyroid and renal function.  Discussed need for periodic lab monitoring to determine drug level and to assess for potential adverse effects.  Counseled patient regarding signs and symptoms of lithium toxicity and advised that they notify office immediately or seek urgent medical attention if experiencing these signs and symptoms.  Patient advised to contact office with any questions or concerns.  Counseling 25 min: Therapy and cognitive behavioral techniques surrounding issues with needed changes in relation to his family interactions.  He has recognized a need to focus more on family and less on external factors or interests.  He had some initial difficulty which would not be on expected and making this transition but has been seeing more success in the last week or so.  Discussed boundaries and limit setting.  Discussed goal setting in relation to job versus family needs.  He is being successful at work.  Pursuit of sobriety has been maintained for 7 years..  FU 12 months because stable on low-dose lithium  Meredith Staggers, MD, DFAPA   Please see After Visit Summary for patient specific instructions.  Future Appointments  Date Time Provider Department Center  03/31/2024 11:00 AM Cottle, Steva Ready., MD [facility] None     Orders Placed This Encounter  Procedures   Lithium level   Lithium level       -------------------------------

## 2023-10-24 DIAGNOSIS — M79644 Pain in right finger(s): Secondary | ICD-10-CM | POA: Diagnosis not present

## 2023-10-24 DIAGNOSIS — L02413 Cutaneous abscess of right upper limb: Secondary | ICD-10-CM | POA: Diagnosis not present

## 2023-11-25 DIAGNOSIS — Z0001 Encounter for general adult medical examination with abnormal findings: Secondary | ICD-10-CM | POA: Diagnosis not present

## 2023-11-25 DIAGNOSIS — E063 Autoimmune thyroiditis: Secondary | ICD-10-CM | POA: Diagnosis not present

## 2023-12-15 ENCOUNTER — Other Ambulatory Visit: Payer: Self-pay | Admitting: Psychiatry

## 2023-12-15 DIAGNOSIS — Z79899 Other long term (current) drug therapy: Secondary | ICD-10-CM

## 2023-12-15 DIAGNOSIS — F3176 Bipolar disorder, in full remission, most recent episode depressed: Secondary | ICD-10-CM

## 2023-12-15 NOTE — Telephone Encounter (Signed)
 Please remind him to get lithium level ordered at last visit ASAP

## 2023-12-16 NOTE — Telephone Encounter (Signed)
 LVM to [facility]

## 2023-12-17 NOTE — Telephone Encounter (Signed)
 Left second VM to [facility].

## 2023-12-18 NOTE — Telephone Encounter (Signed)
 Was finally able to reach patient and he said he got labs done in provider's office. He will have them send [place] results.

## 2024-01-19 ENCOUNTER — Other Ambulatory Visit: Payer: Self-pay | Admitting: Psychiatry

## 2024-01-19 DIAGNOSIS — Z79899 Other long term (current) drug therapy: Secondary | ICD-10-CM

## 2024-01-19 DIAGNOSIS — E063 Autoimmune thyroiditis: Secondary | ICD-10-CM | POA: Diagnosis not present

## 2024-01-19 DIAGNOSIS — Z0001 Encounter for general adult medical examination with abnormal findings: Secondary | ICD-10-CM | POA: Diagnosis not present

## 2024-01-19 DIAGNOSIS — Z79890 Hormone replacement therapy (postmenopausal): Secondary | ICD-10-CM | POA: Diagnosis not present

## 2024-01-19 DIAGNOSIS — F3176 Bipolar disorder, in full remission, most recent episode depressed: Secondary | ICD-10-CM

## 2024-01-19 DIAGNOSIS — Z8249 Family history of ischemic heart disease and other diseases of the circulatory system: Secondary | ICD-10-CM | POA: Diagnosis not present

## 2024-02-25 ENCOUNTER — Other Ambulatory Visit: Payer: Self-pay | Admitting: Psychiatry

## 2024-02-25 DIAGNOSIS — F3176 Bipolar disorder, in full remission, most recent episode depressed: Secondary | ICD-10-CM

## 2024-02-25 DIAGNOSIS — Z79899 Other long term (current) drug therapy: Secondary | ICD-10-CM

## 2024-02-26 ENCOUNTER — Other Ambulatory Visit: Payer: Self-pay | Admitting: Psychiatry

## 2024-02-26 DIAGNOSIS — Z79899 Other long term (current) drug therapy: Secondary | ICD-10-CM

## 2024-02-26 DIAGNOSIS — F3176 Bipolar disorder, in full remission, most recent episode depressed: Secondary | ICD-10-CM

## 2024-02-27 ENCOUNTER — Telehealth: Payer: Self-pay | Admitting: Psychiatry

## 2024-02-27 NOTE — Telephone Encounter (Signed)
 Verified mailing address and mailed Lab orders to Pt.

## 2024-02-28 ENCOUNTER — Other Ambulatory Visit: Payer: Self-pay | Admitting: Psychiatry

## 2024-02-28 DIAGNOSIS — Z79899 Other long term (current) drug therapy: Secondary | ICD-10-CM

## 2024-02-28 DIAGNOSIS — F3176 Bipolar disorder, in full remission, most recent episode depressed: Secondary | ICD-10-CM

## 2024-03-01 ENCOUNTER — Other Ambulatory Visit: Payer: Self-pay | Admitting: Psychiatry

## 2024-03-01 DIAGNOSIS — Z79899 Other long term (current) drug therapy: Secondary | ICD-10-CM

## 2024-03-01 DIAGNOSIS — F3176 Bipolar disorder, in full remission, most recent episode depressed: Secondary | ICD-10-CM

## 2024-03-02 ENCOUNTER — Encounter: Payer: Self-pay | Admitting: Psychiatry

## 2024-03-05 ENCOUNTER — Other Ambulatory Visit: Payer: Self-pay | Admitting: Psychiatry

## 2024-03-05 DIAGNOSIS — F3176 Bipolar disorder, in full remission, most recent episode depressed: Secondary | ICD-10-CM

## 2024-03-05 DIAGNOSIS — Z79899 Other long term (current) drug therapy: Secondary | ICD-10-CM

## 2024-03-05 NOTE — Telephone Encounter (Signed)
 Pls call him and ask him to get a lithium  level from [facility].  I got labs on his CMP but no lithium  level received.

## 2024-03-25 ENCOUNTER — Ambulatory Visit: Admitting: Psychiatry

## 2024-03-31 ENCOUNTER — Ambulatory Visit: Payer: Self-pay | Admitting: Psychiatry

## 2024-04-07 ENCOUNTER — Other Ambulatory Visit: Payer: Self-pay | Admitting: Psychiatry

## 2024-04-07 DIAGNOSIS — Z79899 Other long term (current) drug therapy: Secondary | ICD-10-CM

## 2024-04-07 DIAGNOSIS — F3176 Bipolar disorder, in full remission, most recent episode depressed: Secondary | ICD-10-CM

## 2024-04-07 NOTE — Telephone Encounter (Signed)
 Pt needs refill of Lithium  but is out of town for the next 4 days. Wants meds sent to    [facility] [address]   [place] [address]

## 2024-04-08 NOTE — Telephone Encounter (Signed)
 I sent his lithium  RX.  But he's not gotten lithium  level as requested.  We've requested this several times and he has paper lab orders.  He's not responded to calls from other staff.  Please ask if he is taking lithium  CR 450 mg 2 OR 3 tablets daily.  We need the level to be sure he doesn't get toxic.

## 2024-04-12 NOTE — Telephone Encounter (Signed)
 LM for pt to call back and he needs to get his labs drawn.

## 2024-04-30 DIAGNOSIS — M25512 Pain in left shoulder: Secondary | ICD-10-CM | POA: Diagnosis not present

## 2024-05-09 ENCOUNTER — Other Ambulatory Visit: Payer: Self-pay | Admitting: Psychiatry

## 2024-05-09 DIAGNOSIS — Z79899 Other long term (current) drug therapy: Secondary | ICD-10-CM

## 2024-05-09 DIAGNOSIS — F3176 Bipolar disorder, in full remission, most recent episode depressed: Secondary | ICD-10-CM

## 2024-05-10 DIAGNOSIS — Z79899 Other long term (current) drug therapy: Secondary | ICD-10-CM | POA: Diagnosis not present

## 2024-05-10 DIAGNOSIS — F3176 Bipolar disorder, in full remission, most recent episode depressed: Secondary | ICD-10-CM | POA: Diagnosis not present

## 2024-05-10 NOTE — Telephone Encounter (Signed)
 Pt needs refill on Lithium . He went to the lab to get levels checked today.   [facility] [id number]   [place]

## 2024-05-10 NOTE — Telephone Encounter (Signed)
 Pt is aware he needed to get a lithium  level done. Called him today since he was requesting a RF and he said he did go to [facility] and get lab drawn today. Told him we should have result tomorrow and will send RF based on results.   Due to the low lithium  level I asked pt what dose was he taking and he reports he is taking 3 of the 450 mg tablets, but reports he ran out and did not take the 2 days prior to having a level drawn.

## 2024-05-11 LAB — LITHIUM LEVEL: Lithium Lvl: <0.3 mmol/L — ABNORMAL LOW (ref 0.6–1.2)

## 2024-06-07 DIAGNOSIS — E559 Vitamin D deficiency, unspecified: Secondary | ICD-10-CM | POA: Diagnosis not present

## 2024-06-07 DIAGNOSIS — Z0001 Encounter for general adult medical examination with abnormal findings: Secondary | ICD-10-CM | POA: Diagnosis not present

## 2024-06-07 DIAGNOSIS — Z8249 Family history of ischemic heart disease and other diseases of the circulatory system: Secondary | ICD-10-CM | POA: Diagnosis not present

## 2024-06-07 DIAGNOSIS — Z79890 Hormone replacement therapy (postmenopausal): Secondary | ICD-10-CM | POA: Diagnosis not present

## 2024-06-09 DIAGNOSIS — M24812 Other specific joint derangements of left shoulder, not elsewhere classified: Secondary | ICD-10-CM | POA: Diagnosis not present

## 2024-06-10 ENCOUNTER — Encounter: Payer: Self-pay | Admitting: Psychiatry

## 2024-06-10 ENCOUNTER — Ambulatory Visit: Admitting: Psychiatry

## 2024-06-10 DIAGNOSIS — F1021 Alcohol dependence, in remission: Secondary | ICD-10-CM

## 2024-06-10 DIAGNOSIS — F3176 Bipolar disorder, in full remission, most recent episode depressed: Secondary | ICD-10-CM | POA: Diagnosis not present

## 2024-06-10 DIAGNOSIS — Z79899 Other long term (current) drug therapy: Secondary | ICD-10-CM

## 2024-06-10 MED ORDER — LITHIUM CARBONATE ER 450 MG PO TBCR
1350.0000 mg | EXTENDED_RELEASE_TABLET | Freq: Every day | ORAL | 1 refills | Status: AC
Start: 2024-06-10 — End: ?

## 2024-06-10 NOTE — Progress Notes (Signed)
 Gregory Hardy [id number] Jun 15, 1971 53 y.o.  Subjective:   Patient ID:  Gregory Hardy is a 53 y.o. (DOB [birth year]) male.  Chief Complaint:  Chief Complaint  Patient presents with   Follow-up    HPI Gregory Hardy presents to the office today for follow-up of bipolar disorder.   He was last seen 06/2019.  No meds were changed.    06/27/2020 appt with the following noted. I've been good.  Things been pretty stable.  Still in recovery, 5 years next month.  Goes to a couple of meetings a week.  He feels his brain has recovered from the years of alcohol abuse. Focuses a lot on exercise and nutrition.  Gym daily for a couple of years. 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 Less anger.  Done ok with less lithium  for months..  Uses CBT coping skills.  No mood swings of significance.  No sugar and only water. Good PE with labs. Plan: continue lithum 900  06/27/2021 appt needed: 5 kids. Doing well.  I feel fantastic.  Exercise mandatory and it has dramatically helped mood and health.  Sober still.  Making good money.  Worked hard.  Still playing golf.  No sig conflicts.   Patient reports stable mood and denies depressed or irritable moods.  Patient denies any recent difficulty with anxiety.  Patient denies difficulty with sleep initiation or maintenance. Denies appetite disturbance.  Patient reports that energy and motivation have been good.  Patient denies any difficulty with concentration.  Patient denies any suicidal ideation. No SE. Consistent with meds.  08/13/22 appt noted: Still good stability.  No mood swings, mania or depression. Tolerating meds. Continues lithium  900 mg daily.  No SE Doesn't bother watching movies much now. Still exercise. 7 year sober with chip.  Wife supportive.   5 kids.  From 1 to 20 yo. Going to gym. 44 yo dx ODD and dealing with that.  10/23/23 appt noted: Still doing well.  Mental and physical disciplines help.  Good business.  Good  family.  Stay busy.  Golf.  Gym 2-3 times daily.  Has not paid as much attention to family as he should lately.   Good mood stability.  No swings, mania.   Sober since here.   Tolerating meds. Continues lithium  900 mg daily.  No SE Started thyroid  med  06/10/24 appt noted:  Med:  lithium  1350 mg daily. Has been trying to optimize health.  Continues to work on health and nutrition and doing well. Only missed it 2 days. W pleased with him on lithium  bc short fuse worse without it.   Things better with wife now.   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  Before last relapse  He had 10 years sobriety, differently this time better exercise and diet.  Lives in [place].  Doesn't feel the effects from the lithium  but when out for 2 days and he felt more angry and frustrated.   Questions about smoking cessation.   Review of Systems:  Review of Systems  Neurological:  Negative for tremors and weakness.  Psychiatric/Behavioral:  Negative for agitation, behavioral problems, confusion, decreased concentration, dysphoric mood, hallucinations, self-injury, sleep disturbance and suicidal ideas. The patient is not nervous/anxious and is not hyperactive.     Medications: I have reviewed the patient's current medications.  Current Outpatient Medications  Medication Sig Dispense Refill   Cyanocobalamin  (B-12 PO) Take 1 capsule by mouth daily.     Glucosamine HCl (GLUCOSAMINE PO) Take  1 tablet by mouth 2 (two) times daily.     KRILL OIL PO Take 1 capsule by mouth 2 (two) times daily.     levothyroxine (SYNTHROID) 50 MCG tablet Take 50 mcg by mouth daily before breakfast.     Multiple Vitamin (MULTIVITAMIN) tablet Take 1 tablet by mouth daily.     Saw Palmetto, Serenoa repens, (SAW PALMETTO PO) Take 2 capsules by mouth 2 (two) times daily.     tadalafil (CIALIS) 5 MG tablet Take 5 mg by mouth daily.     tamsulosin (FLOMAX) 0.4 MG CAPS capsule Take 0.4 mg by mouth 2 (two) times daily.  11    TURMERIC PO Take 2 tablets by mouth 2 (two) times daily.     lithium  carbonate (ESKALITH ) 450 MG ER tablet Take 3 tablets (1,350 mg total) by mouth daily. 270 tablet 1   No current facility-administered medications for this visit.    Medication Side Effects: None  Allergies: No Known Allergies  Past Medical History:  Diagnosis Date   Anxiety    Bipolar 1 disorder (HCC)    Hypothyroidism    Opiate addiction (HCC)     History reviewed. No pertinent family history.  Social History   Socioeconomic History   Marital status: Married    Spouse name: Not on file   Number of children: Not on file   Years of education: Not on file   Highest education level: Not on file  Occupational History   Not on file  Tobacco Use   Smoking status: Every Day    Current packs/day: 0.50    Types: E-cigarettes, Cigarettes   Smokeless tobacco: Never  Substance and Sexual Activity   Alcohol use: Not Currently    Comment: At least 6 drinks daily. Last drink: 22:30 last night.    Drug use: Not Currently    Types: Marijuana    Comment: History of heroin. Last used: a few days. Maybe Thursday or Friday of last week   Sexual activity: Not on file  Other Topics Concern   Not on file  Social History Narrative   Not on file   Social Drivers of Health   Financial Resource Strain: Not on file  Food Insecurity: Not on file  Transportation Needs: Not on file  Physical Activity: Not on file  Stress: Not on file  Social Connections: Not on file  Intimate Partner Violence: Not on file    Past Medical History, Surgical history, Social history, and Family history were reviewed and updated as appropriate.   Please see review of systems for further details on the patient's review from today.   Objective:   Physical Exam:  There were no vitals taken for this visit.  Physical Exam Constitutional:      General: He is not in acute distress.    Appearance: He is well-developed.   Musculoskeletal:        General: No deformity.  Neurological:     Mental Status: He is alert and oriented to person, place, and time.     Motor: No tremor.     Coordination: Coordination normal.     Gait: Gait normal.  Psychiatric:        Attention and Perception: He is attentive. He does not perceive auditory hallucinations.        Mood and Affect: Mood is not anxious or depressed. Affect is not labile, blunt, angry or inappropriate.        Speech: Speech normal. Speech is not rapid  and pressured.        Behavior: Behavior normal. Behavior is not agitated.        Thought Content: Thought content normal. Thought content does not include homicidal or suicidal ideation. Thought content does not include homicidal or suicidal plan.        Cognition and Memory: Cognition normal.        Judgment: Judgment normal.     Comments: Insight intact. No auditory or visual hallucinations. No delusions.  No mania. Normal emotional range     Lab Review:     Component Value Date/Time   NA 137 04/30/2023 0929   K 4.7 04/30/2023 0929   CL 103 04/30/2023 0929   CO2 26 04/30/2023 0929   GLUCOSE 96 04/30/2023 0929   BUN 21 (H) 04/30/2023 0929   CREATININE 1.20 04/30/2023 0929   CALCIUM 9.2 04/30/2023 0929   PROT 7.0 04/30/2023 0929   ALBUMIN 3.9 04/30/2023 0929   AST 26 04/30/2023 0929   ALT 32 04/30/2023 0929   ALKPHOS 64 04/30/2023 0929   BILITOT 0.9 04/30/2023 0929   GFRNONAA >60 04/30/2023 0929   GFRAA >60 08/07/2015 0339       Component Value Date/Time   WBC 6.5 04/30/2023 0929   RBC 5.22 04/30/2023 0929   HGB 16.2 04/30/2023 0929   HCT 49.0 04/30/2023 0929   PLT 219 04/30/2023 0929   MCV 93.9 04/30/2023 0929   MCH 31.0 04/30/2023 0929   MCHC 33.1 04/30/2023 0929   RDW 13.1 04/30/2023 0929   LYMPHSABS 2.8 08/07/2015 0339   MONOABS 1.1 (H) 08/07/2015 0339   EOSABS 0.2 08/07/2015 0339   BASOSABS 0.1 08/07/2015 0339    Lithium  Lvl  Date Value Ref Range Status  05/10/2024  <0.3 (L) 0.6 - 1.2 mmol/L Final    Lithium  Lvl  Date Value Ref Range Status  05/10/2024 <0.3 (L) 0.6 - 1.2 mmol/L Final   07/03/21 lithium  0.4 at 900 mg HS with normal testosterone  and TSH  No results found for: PHENYTOIN, PHENOBARB, VALPROATE, CBMZ   lithium  03/09/18 = 0.4 at 900 mg daily. Normal BMP and TSH.  .res Assessment: Plan:    Bipolar 1 disorder, depressed, full remission (HCC) - Plan: lithium  carbonate (ESKALITH ) 450 MG ER tablet  Lithium  use - Plan: lithium  carbonate (ESKALITH ) 450 MG ER tablet  Alcohol dependence in remission (HCC)   Meds stable and mood and mood without problems.  Stable on lithium .    he has been very stable for a number of years.   Repeat lithium  level now and every 6 mos if stable as expected Continue lithium  CR 1350 HS until lithium  level received.  Again Counseled patient regarding potential benefits, risks, and side effects of lithium  to include potential risk of lithium  affecting thyroid  and renal function.  Discussed need for periodic lab monitoring to determine drug level and to assess for potential adverse effects.  Reviewed signs and symptoms of lithium  toxicity and advised that they notify office immediately or seek urgent medical attention if experiencing these signs and symptoms.  Patient advised to contact office with any questions or concerns.  Pursuit of sobriety has been maintained for years..  FU 12 months because stable on low-dose lithium   Lorene Macintosh, MD, DFAPA   Please see After Visit Summary for patient specific instructions.  No future appointments.    No orders of the defined types were placed in this encounter.      -------------------------------

## 2024-06-12 ENCOUNTER — Other Ambulatory Visit: Payer: Self-pay | Admitting: Psychiatry

## 2024-06-12 DIAGNOSIS — Z79899 Other long term (current) drug therapy: Secondary | ICD-10-CM

## 2024-06-12 DIAGNOSIS — F3176 Bipolar disorder, in full remission, most recent episode depressed: Secondary | ICD-10-CM

## 2024-06-14 ENCOUNTER — Other Ambulatory Visit: Payer: Self-pay | Admitting: Psychiatry

## 2024-06-14 ENCOUNTER — Telehealth: Payer: Self-pay | Admitting: Psychiatry

## 2024-06-14 DIAGNOSIS — Z79899 Other long term (current) drug therapy: Secondary | ICD-10-CM

## 2024-06-14 NOTE — Telephone Encounter (Addendum)
 Pt had lithium  level drawn 04/2024.  Order for lithium  level entered for [facility] per patient request. He asked to fax him order as well.

## 2024-06-14 NOTE — Telephone Encounter (Signed)
 Pt needs lab order faxed [phone]

## 2024-06-16 ENCOUNTER — Telehealth: Payer: Self-pay | Admitting: Psychiatry

## 2024-06-16 ENCOUNTER — Encounter: Payer: Self-pay | Admitting: Psychiatry

## 2024-06-16 LAB — LITHIUM LEVEL: Lithium Lvl: 0.4 mmol/L — ABNORMAL LOW (ref 0.6–1.2)

## 2024-06-16 NOTE — Telephone Encounter (Signed)
 Pt.notified

## 2024-06-16 NOTE — Telephone Encounter (Signed)
 Pt got his lithium  level results and thinks he needs to increase the dosage. Please cb to discuss.

## 2024-06-16 NOTE — Telephone Encounter (Signed)
Pt aware of My Chart message.

## 2024-06-16 NOTE — Telephone Encounter (Signed)
 Pt had lithium  level drawn yesterday - was 0.4. He said he takes lithium  at night and got level drawn at about 3 PM. He is on 1350 mg.

## 2024-06-16 NOTE — Telephone Encounter (Signed)
 Sent patient message and copied you on it.

## 2024-06-17 DIAGNOSIS — M25512 Pain in left shoulder: Secondary | ICD-10-CM | POA: Diagnosis not present

## 2024-06-18 DIAGNOSIS — K4090 Unilateral inguinal hernia, without obstruction or gangrene, not specified as recurrent: Secondary | ICD-10-CM | POA: Diagnosis not present

## 2024-06-18 DIAGNOSIS — Z9889 Other specified postprocedural states: Secondary | ICD-10-CM | POA: Diagnosis not present

## 2024-06-23 ENCOUNTER — Ambulatory Visit: Payer: Self-pay | Admitting: Psychiatry

## 2024-06-23 NOTE — Progress Notes (Signed)
 Level 0.4 about 16+ hours after dosing.  So no change indicated bc good response.

## 2024-06-28 DIAGNOSIS — M19012 Primary osteoarthritis, left shoulder: Secondary | ICD-10-CM | POA: Diagnosis not present

## 2024-07-13 DIAGNOSIS — G8918 Other acute postprocedural pain: Secondary | ICD-10-CM | POA: Diagnosis not present

## 2024-07-13 DIAGNOSIS — M7552 Bursitis of left shoulder: Secondary | ICD-10-CM | POA: Diagnosis not present

## 2024-07-13 DIAGNOSIS — M7542 Impingement syndrome of left shoulder: Secondary | ICD-10-CM | POA: Diagnosis not present

## 2024-07-13 DIAGNOSIS — X58XXXA Exposure to other specified factors, initial encounter: Secondary | ICD-10-CM | POA: Diagnosis not present

## 2024-07-13 DIAGNOSIS — Y999 Unspecified external cause status: Secondary | ICD-10-CM | POA: Diagnosis not present

## 2024-07-13 DIAGNOSIS — M7582 Other shoulder lesions, left shoulder: Secondary | ICD-10-CM | POA: Diagnosis not present

## 2024-07-13 DIAGNOSIS — M19012 Primary osteoarthritis, left shoulder: Secondary | ICD-10-CM | POA: Diagnosis not present

## 2024-07-13 DIAGNOSIS — S43432A Superior glenoid labrum lesion of left shoulder, initial encounter: Secondary | ICD-10-CM | POA: Diagnosis not present

## 2024-07-13 DIAGNOSIS — M24112 Other articular cartilage disorders, left shoulder: Secondary | ICD-10-CM | POA: Diagnosis not present

## 2024-07-13 DIAGNOSIS — M75112 Incomplete rotator cuff tear or rupture of left shoulder, not specified as traumatic: Secondary | ICD-10-CM | POA: Diagnosis not present

## 2024-07-13 DIAGNOSIS — M94212 Chondromalacia, left shoulder: Secondary | ICD-10-CM | POA: Diagnosis not present

## 2024-07-27 DIAGNOSIS — M25612 Stiffness of left shoulder, not elsewhere classified: Secondary | ICD-10-CM | POA: Diagnosis not present

## 2024-08-03 DIAGNOSIS — M25612 Stiffness of left shoulder, not elsewhere classified: Secondary | ICD-10-CM | POA: Diagnosis not present

## 2025-06-14 ENCOUNTER — Ambulatory Visit: Payer: Self-pay | Admitting: Psychiatry
